# Patient Record
Sex: Female | Born: 1980 | Race: White | Hispanic: No | Marital: Married | Smoking: Never smoker
Health system: Southern US, Community
[De-identification: ages and names within clinical notes are randomized; demographics above are authoritative.]

## PROBLEM LIST (undated history)

## (undated) ENCOUNTER — Inpatient Hospital Stay: Payer: Self-pay

## (undated) DIAGNOSIS — G5600 Carpal tunnel syndrome, unspecified upper limb: Secondary | ICD-10-CM

## (undated) DIAGNOSIS — K219 Gastro-esophageal reflux disease without esophagitis: Secondary | ICD-10-CM

## (undated) DIAGNOSIS — F419 Anxiety disorder, unspecified: Secondary | ICD-10-CM

## (undated) HISTORY — PX: WISDOM TOOTH EXTRACTION: SHX21

---
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
## 2004-04-04 ENCOUNTER — Other Ambulatory Visit: Admission: RE | Admit: 2004-04-04 | Discharge: 2004-04-04 | Payer: Self-pay | Admitting: Obstetrics and Gynecology

## 2005-04-06 ENCOUNTER — Other Ambulatory Visit: Admission: RE | Admit: 2005-04-06 | Discharge: 2005-04-06 | Payer: Self-pay | Admitting: Obstetrics and Gynecology

## 2011-08-05 ENCOUNTER — Ambulatory Visit: Payer: Self-pay

## 2013-08-08 ENCOUNTER — Encounter (HOSPITAL_COMMUNITY): Payer: Self-pay

## 2013-08-08 ENCOUNTER — Other Ambulatory Visit (HOSPITAL_COMMUNITY): Payer: Self-pay

## 2015-05-01 ENCOUNTER — Other Ambulatory Visit: Payer: Self-pay

## 2015-05-01 ENCOUNTER — Encounter: Payer: Self-pay | Admitting: *Deleted

## 2015-05-01 NOTE — Patient Instructions (Signed)
  Your procedure is scheduled on: 05-08-15 Report to [facility] 2ND FLOOR To find out your arrival time please call [phone] between 1PM - 3PM on 05-07-15  Remember: Instructions that are not followed completely may result in serious medical risk, up to and including death, or upon the discretion of your surgeon and anesthesiologist your surgery may need to be rescheduled.    _X___ 1. Do not eat food or drink liquids after midnight. No gum chewing or hard candies.     _X___ 2. No Alcohol for 24 hours before or after surgery.   ____ 3. Bring all medications with you on the day of surgery if instructed.    ____ 4. Notify your doctor if there is any change in your medical condition     (cold, fever, infections).     Do not wear jewelry, make-up, hairpins, clips or nail polish.  Do not wear lotions, powders, or perfumes. You may wear deodorant.  Do not shave 48 hours prior to surgery. Men may shave face and neck.  Do not bring valuables to the hospital.    [facility] is not responsible for any belongings or valuables.               Contacts, dentures or bridgework may not be worn into surgery.  Leave your suitcase in the car. After surgery it may be brought to your room.  For patients admitted to the hospital, discharge time is determined by your  treatment team.   Patients discharged the day of surgery will not be allowed to drive home.   Please read over the following fact sheets that you were given:     __X__ Take these medicines the morning of surgery with A SIP OF WATER:    1. OMEPRAZOLE  2. TAKE AN EXTRA OMEPRAZOLE Tuesday NIGHT  3.   4.  5.  6.  ____ Fleet Enema (as directed)   ____ Use CHG Soap as directed  ____ Use inhalers on the day of surgery  ____ Stop metformin 2 days prior to surgery    ____ Take 1/2 of usual insulin dose the night before surgery and none on the morning of surgery.   ____ Stop Coumadin/Plavix/aspirin-N/A  ____ Stop  Anti-inflammatories-NO NSAIDS OR ASA PRODUCTS-TYLENOL OK   ____ Stop supplements until after surgery.    ____ Bring C-Pap to the hospital.

## 2015-05-08 ENCOUNTER — Encounter
Admission: RE | Disposition: A | Discharge status: Home / Self Care | Payer: Self-pay | Source: Ambulatory Visit | Attending: Specialist

## 2015-05-08 ENCOUNTER — Ambulatory Visit: Payer: BLUE CROSS/BLUE SHIELD | Admitting: Anesthesiology

## 2015-05-08 ENCOUNTER — Ambulatory Visit
Admission: RE | Admit: 2015-05-08 | Discharge: 2015-05-08 | Disposition: A | Discharge status: Home / Self Care | Payer: BLUE CROSS/BLUE SHIELD | Source: Ambulatory Visit | Attending: Specialist | Admitting: Specialist

## 2015-05-08 ENCOUNTER — Encounter: Payer: Self-pay | Admitting: *Deleted

## 2015-05-08 DIAGNOSIS — Z793 Long term (current) use of hormonal contraceptives: Secondary | ICD-10-CM | POA: Diagnosis not present

## 2015-05-08 DIAGNOSIS — Z791 Long term (current) use of non-steroidal anti-inflammatories (NSAID): Secondary | ICD-10-CM | POA: Insufficient documentation

## 2015-05-08 DIAGNOSIS — G5602 Carpal tunnel syndrome, left upper limb: Secondary | ICD-10-CM | POA: Insufficient documentation

## 2015-05-08 DIAGNOSIS — G5601 Carpal tunnel syndrome, right upper limb: Secondary | ICD-10-CM | POA: Insufficient documentation

## 2015-05-08 HISTORY — PX: CARPAL TUNNEL RELEASE: SHX101

## 2015-05-08 HISTORY — DX: Gastro-esophageal reflux disease without esophagitis: K21.9

## 2015-05-08 HISTORY — DX: Anxiety disorder, unspecified: F41.9

## 2015-05-08 LAB — POCT PREGNANCY, URINE: Preg Test, Ur: NEGATIVE

## 2015-05-08 SURGERY — CARPAL TUNNEL RELEASE
Anesthesia: General | Laterality: Bilateral

## 2015-05-08 MED ORDER — CEFAZOLIN SODIUM 1-5 GM-% IV SOLN
INTRAVENOUS | Status: AC
  Filled 2015-05-08: qty 50

## 2015-05-08 MED ORDER — ONDANSETRON HCL 4 MG/2ML IJ SOLN
INTRAMUSCULAR | Status: DC | PRN
  Administered 2015-05-08: 4 mg via INTRAVENOUS

## 2015-05-08 MED ORDER — FENTANYL CITRATE (PF) 100 MCG/2ML IJ SOLN
INTRAMUSCULAR | Status: DC | PRN
  Administered 2015-05-08 (×2): 50 ug via INTRAVENOUS

## 2015-05-08 MED ORDER — ONDANSETRON HCL 4 MG/2ML IJ SOLN: 4.0000 mg | Freq: Once | INTRAMUSCULAR | Status: DC | PRN

## 2015-05-08 MED ORDER — MELOXICAM 7.5 MG PO TABS
15.0000 mg | ORAL_TABLET | Freq: Once | ORAL | Status: AC
  Administered 2015-05-08: 15 mg via ORAL

## 2015-05-08 MED ORDER — KETOROLAC TROMETHAMINE 30 MG/ML IJ SOLN
INTRAMUSCULAR | Status: DC | PRN
  Administered 2015-05-08: 30 mg via INTRAVENOUS

## 2015-05-08 MED ORDER — BUPIVACAINE HCL (PF) 0.5 % IJ SOLN
INTRAMUSCULAR | Status: AC
  Filled 2015-05-08: qty 60

## 2015-05-08 MED ORDER — LACTATED RINGERS IV SOLN
INTRAVENOUS | Status: DC
  Administered 2015-05-08: 50 mL/h via INTRAVENOUS
  Administered 2015-05-08: 07:00:00 via INTRAVENOUS

## 2015-05-08 MED ORDER — HYDROCODONE-ACETAMINOPHEN 5-325 MG PO TABS
ORAL_TABLET | ORAL | Status: AC
  Administered 2015-05-08: 1 via ORAL
  Filled 2015-05-08: qty 1

## 2015-05-08 MED ORDER — HYDROCODONE-ACETAMINOPHEN 5-325 MG PO TABS
1.0000 | ORAL_TABLET | Freq: Four times a day (QID) | ORAL | Status: DC | PRN
  Administered 2015-05-08: 1 via ORAL

## 2015-05-08 MED ORDER — BUPIVACAINE HCL 0.5 % IJ SOLN
INTRAMUSCULAR | Status: DC | PRN
  Administered 2015-05-08: 30 mL

## 2015-05-08 MED ORDER — MELOXICAM 15 MG PO TABS: 15.0000 mg | ORAL_TABLET | Freq: Every day | ORAL | Status: DC

## 2015-05-08 MED ORDER — LIDOCAINE HCL (PF) 1 % IJ SOLN
INTRAMUSCULAR | Status: AC
  Filled 2015-05-08: qty 2

## 2015-05-08 MED ORDER — DEXAMETHASONE SODIUM PHOSPHATE 4 MG/ML IJ SOLN
INTRAMUSCULAR | Status: DC | PRN
  Administered 2015-05-08: 5 mg via INTRAVENOUS

## 2015-05-08 MED ORDER — CEFAZOLIN SODIUM 1-5 GM-% IV SOLN
1.0000 g | Freq: Once | INTRAVENOUS | Status: AC
  Administered 2015-05-08: 2 g via INTRAVENOUS

## 2015-05-08 MED ORDER — FAMOTIDINE 20 MG PO TABS
ORAL_TABLET | ORAL | Status: AC
  Filled 2015-05-08: qty 1

## 2015-05-08 MED ORDER — HYDROCODONE-ACETAMINOPHEN 5-325 MG PO TABS: 1.0000 | ORAL_TABLET | Freq: Four times a day (QID) | ORAL | Status: DC | PRN

## 2015-05-08 MED ORDER — GABAPENTIN 400 MG PO CAPS
ORAL_CAPSULE | ORAL | Status: AC
  Administered 2015-05-08: 400 mg via ORAL
  Filled 2015-05-08: qty 1

## 2015-05-08 MED ORDER — MIDAZOLAM HCL 2 MG/2ML IJ SOLN
INTRAMUSCULAR | Status: DC | PRN
  Administered 2015-05-08: 2 mg via INTRAVENOUS

## 2015-05-08 MED ORDER — PROPOFOL 10 MG/ML IV BOLUS
INTRAVENOUS | Status: DC | PRN
  Administered 2015-05-08: 150 mg via INTRAVENOUS
  Administered 2015-05-08: 50 mg via INTRAVENOUS

## 2015-05-08 MED ORDER — GABAPENTIN 400 MG PO CAPS: 400.0000 mg | ORAL_CAPSULE | Freq: Three times a day (TID) | ORAL | Status: DC

## 2015-05-08 MED ORDER — FENTANYL CITRATE (PF) 100 MCG/2ML IJ SOLN: 25.0000 ug | INTRAMUSCULAR | Status: DC | PRN

## 2015-05-08 MED ORDER — GABAPENTIN 400 MG PO CAPS
400.0000 mg | ORAL_CAPSULE | Freq: Once | ORAL | Status: AC
  Administered 2015-05-08: 400 mg via ORAL

## 2015-05-08 MED ORDER — LIDOCAINE HCL (CARDIAC) 20 MG/ML IV SOLN
INTRAVENOUS | Status: DC | PRN
  Administered 2015-05-08: 60 mg via INTRAVENOUS

## 2015-05-08 MED ORDER — MELOXICAM 7.5 MG PO TABS
ORAL_TABLET | ORAL | Status: AC
  Administered 2015-05-08: 15 mg via ORAL
  Filled 2015-05-08: qty 2

## 2015-05-08 SURGICAL SUPPLY — 23 items
BLADE SURG MINI STRL (BLADE) ×3 IMPLANT
BNDG ESMARK 4X12 TAN STRL LF (GAUZE/BANDAGES/DRESSINGS) ×3 IMPLANT
CANISTER SUCT 1200ML W/VALVE (MISCELLANEOUS) ×3 IMPLANT
CHLORAPREP W/TINT 26ML (MISCELLANEOUS) ×5 IMPLANT
DRAPE EXTREMITY 106X87X128.5 (DRAPES) ×2 IMPLANT
GAUZE FLUFF 18X24 1PLY STRL (GAUZE/BANDAGES/DRESSINGS) ×5 IMPLANT
GAUZE PETRO XEROFOAM 1X8 (MISCELLANEOUS) ×3 IMPLANT
GLOVE BIO SURGEON STRL SZ8 (GLOVE) ×7 IMPLANT
GOWN STRL REUS W/ TWL LRG LVL3 (GOWN DISPOSABLE) ×2 IMPLANT
GOWN STRL REUS W/TWL LRG LVL3 (GOWN DISPOSABLE) ×9
KIT RM TURNOVER STRD PROC AR (KITS) ×3 IMPLANT
NS IRRIG 500ML POUR BTL (IV SOLUTION) ×3 IMPLANT
PACK EXTREMITY ARMC (MISCELLANEOUS) ×3 IMPLANT
PAD GROUND ADULT SPLIT (MISCELLANEOUS) ×3 IMPLANT
PAD PREP 24X41 OB/GYN DISP (PERSONAL CARE ITEMS) ×3 IMPLANT
SPLINT CAST 1 STEP 3X12 (MISCELLANEOUS) ×5 IMPLANT
STOCKINETTE 48X4 2 PLY STRL (GAUZE/BANDAGES/DRESSINGS) ×1 IMPLANT
STOCKINETTE BIAS CUT 4 980044 (GAUZE/BANDAGES/DRESSINGS) ×3 IMPLANT
STOCKINETTE STRL 4IN 9604848 (GAUZE/BANDAGES/DRESSINGS) ×6 IMPLANT
SUT ETHILON 4-0 (SUTURE) ×3
SUT ETHILON 4-0 FS2 18XMFL BLK (SUTURE) ×1
SUT ETHILON 5-0 FS-2 18 BLK (SUTURE) ×3 IMPLANT
SUTURE ETHLN 4-0 FS2 18XMF BLK (SUTURE) ×1 IMPLANT

## 2015-05-08 NOTE — H&P (Signed)
THE PATIENT WAS SEEN IN THE HOLDING AREA.  HISTORY, ALLERGIES, HOME MEDICATIONS AND OPERATIVE PROCEDURE WERE REVIEWED. RISKS AND BENEFITS OF SURGERY DISCUSSED WITH PATIENT AGAIN.  NO CHANGES FROM INITIAL HISTORY AND PHYSICAL NOTED.    

## 2015-05-08 NOTE — Anesthesia Procedure Notes (Signed)
Procedure Name: LMA Insertion Date/Time: 05/08/2015 7:48 AM Performed by: Irving Burton Pre-anesthesia Checklist: Patient identified, Emergency Drugs available, Suction available and Patient being monitored Patient Re-evaluated:Patient Re-evaluated prior to inductionOxygen Delivery Method: Circle system utilized Preoxygenation: Pre-oxygenation with 100% oxygen Intubation Type: IV induction Ventilation: Mask ventilation without difficulty LMA: LMA inserted LMA Size: 3.5 Number of attempts: 1 Airway Equipment and Method: Patient positioned with wedge pillow Placement Confirmation: positive ETCO2 and breath sounds checked- equal and bilateral Tube secured with: Tape Dental Injury: Teeth and Oropharynx as per pre-operative assessment

## 2015-05-08 NOTE — Anesthesia Postprocedure Evaluation (Signed)
  Anesthesia Post-op Note  Patient: Tracey Swanson  Procedure(s) Performed: Procedure(s): CARPAL TUNNEL RELEASE (Bilateral)  Anesthesia type:General  Patient location: PACU  Post pain: Pain level controlled  Post assessment: Post-op Vital signs reviewed, Patient's Cardiovascular Status Stable, Respiratory Function Stable, Patent Airway and No signs of Nausea or vomiting  Post vital signs: Reviewed and stable  Last Vitals:  Filed Vitals:   05/08/15 0908  BP: 160/88  Pulse: 71  Temp: 36.1 C  Resp: 14    Level of consciousness: awake, alert  and patient cooperative  Complications: No apparent anesthesia complications

## 2015-05-08 NOTE — Transfer of Care (Signed)
Immediate Anesthesia Transfer of Care Note  Patient: Tracey Swanson  Procedure(s) Performed: Procedure(s): CARPAL TUNNEL RELEASE (Bilateral)  Patient Location: PACU  Anesthesia Type:General  Level of Consciousness: sedated  Airway & Oxygen Therapy: Patient Spontanous Breathing and Patient connected to face mask oxygen  Post-op Assessment: Report given to RN and Post -op Vital signs reviewed and stable  Post vital signs: Reviewed and stable  Last Vitals:  Filed Vitals:   05/08/15 0908  BP: 160/88  Pulse: 71  Temp: 36.1 C  Resp: 14    Complications: No apparent anesthesia complications

## 2015-05-08 NOTE — Anesthesia Preprocedure Evaluation (Signed)
Anesthesia Evaluation  Patient identified by MRN, date of birth, ID band Patient awake    Reviewed: Allergy & Precautions, NPO status   Airway Mallampati: II       Dental no notable dental hx. (+) Teeth Intact   Pulmonary neg pulmonary ROS,    breath sounds clear to auscultation       Cardiovascular Exercise Tolerance: Good negative cardio ROS Normal cardiovascular exam     Neuro/Psych    GI/Hepatic Neg liver ROS, GERD  Medicated,  Endo/Other  negative endocrine ROS  Renal/GU negative Renal ROS     Musculoskeletal negative musculoskeletal ROS (+)   Abdominal (+) + obese,   Peds negative pediatric ROS (+)  Hematology negative hematology ROS (+)   Anesthesia Other Findings   Reproductive/Obstetrics negative OB ROS                             Anesthesia Physical Anesthesia Plan  ASA: II  Anesthesia Plan: General   Post-op Pain Management:    Induction: Intravenous  Airway Management Planned: LMA  Additional Equipment:   Intra-op Plan:   Post-operative Plan: Extubation in OR  Informed Consent: I have reviewed the patients History and Physical, chart, labs and discussed the procedure including the risks, benefits and alternatives for the proposed anesthesia with the patient or authorized representative who has indicated his/her understanding and acceptance.     Plan Discussed with: CRNA  Anesthesia Plan Comments:         Anesthesia Quick Evaluation

## 2015-05-08 NOTE — Op Note (Signed)
05/08/2015  8:59 AM  PATIENT:  Tracey Swanson    PRE-OPERATIVE DIAGNOSIS: BILATERAL CARPAL TUNNEL SYNDROME POST-OPERATIVE DIAGNOSIS: BILATERAL CARPAL TUNNEL SYNDROME  PROCEDURE:  BILATERAL CARPAL TUNNEL RELEASE  SURGEON: Valinda Hoar, MD    ANESTHESIA:   General  TOURNIQUET TIME: RIGHT: 16 MIN   LEFT:  22 MIN  PREOPERATIVE INDICATIONS:  Tracey Swanson is a  34 y.o. female with a diagnosis of BILATERAL carpal tunnel syndrome who failed conservative measures and elected for surgical management.    The risks benefits and alternatives were discussed with the patient preoperatively including but not limited to the risks of infection, bleeding, nerve injury, incomplete relief of symptoms, pillar pain, cardiopulmonary complications, the need for revision surgery, among others, and the patient was willing to proceed.  OPERATIVE FINDINGS: Thickened volar ligament and nerve compression bilaterally.  OPERATIVE PROCEDURE: The patient is brought to the operating room placed in the supine position. General anesthesia was administered. The right upper extremity was prepped and draped in usual sterile fashion. Time out was performed. The arm was elevated and exsanguinated and the tourniquet was inflated. Incision was made in line with the radial border of the ring finger. The carpal tunnel transverse fascia was identified, cleaned, and incised sharply. The common sensory branches were visualized along with the superficial palmar arch and protected.  The median nerve was protected below. Deep retractors were placed underneath the transverse carpal ligament, protecting the nerve. I released the ligament completely, and then released the proximal distal volar forearm fascia. The nerve was identified, and visualized, and protected throughout the case. No masses or abnormalities were identified in ulnar bursa.  The wounds were irrigated copiously, and the wounds injected, and the skin closed with nylon  followed by a  sterile hand dressing. Sponge and needle counts were correct. An identical procedure was then carried out on the left hand. The motor branches were visualized and intact bilaterally. Sterile hand dressing with splint was applied to the left hand and tourniquet released with good return of blood flow to the left hand. A splint was then added to the right dressing.  The patient tolerated this well, with no complications. Awakened and taken to recovery in good condition.

## 2015-05-08 NOTE — Discharge Instructions (Signed)

## 2016-01-12 ENCOUNTER — Encounter: Payer: Self-pay | Admitting: Emergency Medicine

## 2016-01-12 ENCOUNTER — Emergency Department
Admission: EM | Admit: 2016-01-12 | Discharge: 2016-01-12 | Disposition: A | Discharge status: Home / Self Care | Payer: BLUE CROSS/BLUE SHIELD | Attending: Emergency Medicine | Admitting: Emergency Medicine

## 2016-01-12 ENCOUNTER — Emergency Department: Payer: BLUE CROSS/BLUE SHIELD

## 2016-01-12 DIAGNOSIS — Z79899 Other long term (current) drug therapy: Secondary | ICD-10-CM | POA: Diagnosis not present

## 2016-01-12 DIAGNOSIS — R110 Nausea: Secondary | ICD-10-CM | POA: Diagnosis present

## 2016-01-12 DIAGNOSIS — R42 Dizziness and giddiness: Secondary | ICD-10-CM | POA: Insufficient documentation

## 2016-01-12 DIAGNOSIS — Z791 Long term (current) use of non-steroidal anti-inflammatories (NSAID): Secondary | ICD-10-CM | POA: Diagnosis not present

## 2016-01-12 LAB — CBC
HEMATOCRIT: 42.6 % (ref 35.0–47.0)
Hemoglobin: 14.2 g/dL (ref 12.0–16.0)
MCH: 27.9 pg (ref 26.0–34.0)
MCHC: 33.4 g/dL (ref 32.0–36.0)
MCV: 83.7 fL (ref 80.0–100.0)
PLATELETS: 156 10*3/uL (ref 150–440)
RBC: 5.09 MIL/uL (ref 3.80–5.20)
RDW: 12.7 % (ref 11.5–14.5)
WBC: 12.4 10*3/uL — ABNORMAL HIGH (ref 3.6–11.0)

## 2016-01-12 LAB — URINALYSIS COMPLETE WITH MICROSCOPIC (ARMC ONLY)
BILIRUBIN URINE: NEGATIVE
GLUCOSE, UA: NEGATIVE mg/dL
Hgb urine dipstick: NEGATIVE
KETONES UR: NEGATIVE mg/dL
NITRITE: NEGATIVE
Protein, ur: NEGATIVE mg/dL
Specific Gravity, Urine: 1.026 (ref 1.005–1.030)
pH: 7 (ref 5.0–8.0)

## 2016-01-12 LAB — COMPREHENSIVE METABOLIC PANEL
ALBUMIN: 4 g/dL (ref 3.5–5.0)
ALK PHOS: 75 U/L (ref 38–126)
ALT: 50 U/L (ref 14–54)
AST: 29 U/L (ref 15–41)
Anion gap: 5 (ref 5–15)
BILIRUBIN TOTAL: 0.6 mg/dL (ref 0.3–1.2)
BUN: 9 mg/dL (ref 6–20)
CO2: 25 mmol/L (ref 22–32)
CREATININE: 0.66 mg/dL (ref 0.44–1.00)
Calcium: 8.7 mg/dL — ABNORMAL LOW (ref 8.9–10.3)
Chloride: 107 mmol/L (ref 101–111)
GFR calc Af Amer: >60 mL/min (ref >60)
GLUCOSE: 93 mg/dL (ref 65–99)
POTASSIUM: 3.8 mmol/L (ref 3.5–5.1)
Sodium: 137 mmol/L (ref 135–145)
TOTAL PROTEIN: 6.9 g/dL (ref 6.5–8.1)

## 2016-01-12 LAB — LIPASE, BLOOD: Lipase: 41 U/L (ref 11–51)

## 2016-01-12 LAB — TSH: TSH: 0.532 u[IU]/mL (ref 0.350–4.500)

## 2016-01-12 MED ORDER — LORAZEPAM 1 MG PO TABS: 1.0000 mg | ORAL_TABLET | Freq: Two times a day (BID) | ORAL | Status: AC

## 2016-01-12 MED ORDER — ONDANSETRON HCL 4 MG PO TABS: 4.0000 mg | ORAL_TABLET | Freq: Every day | ORAL | Status: DC | PRN

## 2016-01-12 MED ORDER — ONDANSETRON 4 MG PO TBDP
ORAL_TABLET | ORAL | Status: AC
  Filled 2016-01-12: qty 1

## 2016-01-12 MED ORDER — ONDANSETRON 4 MG PO TBDP
4.0000 mg | ORAL_TABLET | Freq: Once | ORAL | Status: AC | PRN
  Administered 2016-01-12: 4 mg via ORAL

## 2016-01-12 NOTE — Discharge Instructions (Signed)

## 2016-01-12 NOTE — ED Provider Notes (Signed)
[facility] Emergency Department Provider Note        Time seen: ----------------------------------------- 6:55 PM on 01/12/2016 -----------------------------------------    I have reviewed the triage vital signs and the nursing notes.   HISTORY  Chief Complaint Nausea and Dizziness    HPI Tracey Swanson is a 35 y.o. female who presents ER for nausea and dizziness.Patient is also complaining of pain in her low back and hips. Onset of her symptoms was this morning. She had taken gabapentin and Tylenol for pain without much relief. She has had carpal tunnel surgery in September and states the left wrist starting to bother her again. She does admit to being under increased stress at her job, denies fevers, chills, chest pain, shortness of breath, diarrhea.   Past Medical History  Diagnosis Date  . Anxiety   . GERD (gastroesophageal reflux disease)     There are no active problems to display for this patient.   Past Surgical History  Procedure Laterality Date  . Breast biopsy    . Wisdom tooth extraction    . Carpal tunnel release Bilateral 05/08/2015    Procedure: CARPAL TUNNEL RELEASE;  Surgeon: Deeann SaintHoward Miller, MD;  Location: [facility] ORS;  Service: Orthopedics;  Laterality: Bilateral;    Allergies Review of patient's allergies indicates no known allergies.  Social History Social History  Substance Use Topics  . Smoking status: Never Smoker   . Smokeless tobacco: None  . Alcohol Use: Yes     Comment: SOCIALLY    Review of Systems Constitutional: Negative for fever. Eyes: Negative for visual changes. ENT: Negative for sore throat. Cardiovascular: Negative for chest pain. Respiratory: Negative for shortness of breath. Gastrointestinal: Negative for abdominal pain, Positive for nausea Genitourinary: Negative for dysuria. Musculoskeletal: Positive for back pain Skin: Negative for rash. Neurological: Negative for headaches, positive for  weakness Psychiatric: Positive for anxiety and stress  10-point ROS otherwise negative.  ____________________________________________   PHYSICAL EXAM:  VITAL SIGNS: ED Triage Vitals  Enc Vitals Group     BP 01/12/16 1734 102/57 mmHg     Pulse Rate 01/12/16 1734 91     Resp 01/12/16 1734 20     Temp 01/12/16 1734 98.2 F (36.8 C)     Temp Source 01/12/16 1734 Oral     SpO2 01/12/16 1734 98 %     Weight 01/12/16 1734 180 lb (81.647 kg)     Height 01/12/16 1734 5\' 4"  (1.626 m)     Head Cir --      Peak Flow --      Pain Score 01/12/16 1740 7     Pain Loc --      Pain Edu? --      Excl. in GC? --     Constitutional: Alert and oriented. Well appearing and in no distress. Eyes: Conjunctivae are normal. PERRL. Normal extraocular movements. ENT   Head: Normocephalic and atraumatic.   Nose: No congestion/rhinnorhea.   Mouth/Throat: Mucous membranes are moist.   Neck: No stridor. Cardiovascular: Normal rate, regular rhythm. No murmurs, rubs, or gallops. Respiratory: Normal respiratory effort without tachypnea nor retractions. Breath sounds are clear and equal bilaterally. No wheezes/rales/rhonchi. Gastrointestinal: Soft and nontender. Normal bowel sounds Musculoskeletal: Nontender with normal range of motion in all extremities. No lower extremity tenderness nor edema. Neurologic:  Normal speech and language. No gross focal neurologic deficits are appreciated.  Skin:  Skin is warm, dry and intact. No rash noted. Psychiatric: Depressed mood and affect  ____________________________________________  ED COURSE:  Pertinent labs & imaging results that were available during my care of the patient were reviewed by me and considered in my medical decision making (see chart for details). Patient is in no acute distress, symptoms are likely related to stress and anxiety. I will check basic labs and reevaluate. ____________________________________________    LABS (pertinent  positives/negatives)  Labs Reviewed  COMPREHENSIVE METABOLIC PANEL - Abnormal; Notable for the following:    Calcium 8.7 (*)    All other components within normal limits  CBC - Abnormal; Notable for the following:    WBC 12.4 (*)    All other components within normal limits  URINALYSIS COMPLETEWITH MICROSCOPIC ([facility] ONLY) - Abnormal; Notable for the following:    Color, Urine YELLOW (*)    APPearance CLEAR (*)    Leukocytes, UA TRACE (*)    Bacteria, UA RARE (*)    Squamous Epithelial / LPF 0-5 (*)    All other components within normal limits  LIPASE, BLOOD  POC URINE PREG, ED   Radiology: Lumbar spine films are unremarkable ____________________________________________  FINAL ASSESSMENT AND PLAN  Weakness, anxiety, nausea  Plan: Patient with labs and imaging as dictated above. Patient presents with symptoms likely secondary to stress and anxiety. Her labs and test to this point have been unremarkable. I will advise close follow-up with her doctor for reevaluation and will provide Ativan to take as needed.   Emily Filbert, MD   Note: This dictation was prepared with Dragon dictation. Any transcriptional errors that result from this process are unintentional   Emily Filbert, MD 01/12/16 [phone]

## 2016-01-12 NOTE — ED Notes (Signed)
C/O nausea and dizziness, pains to lower back and hips.  Onset of symptoms this morning.  Patient has taken a gabapentin and tylenol for pain, no relief.  Patient had bilateral Carpel Tunnel surgery September 2016.

## 2016-01-12 NOTE — ED Notes (Signed)
Urine preg negative

## 2016-11-04 ENCOUNTER — Other Ambulatory Visit: Payer: Self-pay | Admitting: Family Medicine

## 2016-11-04 DIAGNOSIS — Z1231 Encounter for screening mammogram for malignant neoplasm of breast: Secondary | ICD-10-CM

## 2016-11-24 ENCOUNTER — Inpatient Hospital Stay
Admission: RE | Admit: 2016-11-24 | Discharge: 2016-11-24 | Disposition: A | Discharge status: Home / Self Care | Payer: Self-pay | Source: Ambulatory Visit | Attending: *Deleted | Admitting: *Deleted

## 2016-11-24 ENCOUNTER — Other Ambulatory Visit: Payer: Self-pay | Admitting: *Deleted

## 2016-11-24 DIAGNOSIS — Z9289 Personal history of other medical treatment: Secondary | ICD-10-CM

## 2016-11-27 ENCOUNTER — Other Ambulatory Visit: Payer: Self-pay | Admitting: Family Medicine

## 2016-11-27 ENCOUNTER — Ambulatory Visit
Admission: RE | Admit: 2016-11-27 | Discharge: 2016-11-27 | Disposition: A | Discharge status: Home / Self Care | Payer: BLUE CROSS/BLUE SHIELD | Source: Ambulatory Visit | Attending: Family Medicine | Admitting: Family Medicine

## 2016-11-27 DIAGNOSIS — Z1231 Encounter for screening mammogram for malignant neoplasm of breast: Secondary | ICD-10-CM | POA: Diagnosis present

## 2017-02-23 ENCOUNTER — Other Ambulatory Visit: Payer: Self-pay | Admitting: Obstetrics and Gynecology

## 2017-02-23 DIAGNOSIS — Z369 Encounter for antenatal screening, unspecified: Secondary | ICD-10-CM

## 2017-02-23 LAB — OB RESULTS CONSOLE RUBELLA ANTIBODY, IGM: Rubella: IMMUNE

## 2017-02-23 LAB — OB RESULTS CONSOLE VARICELLA ZOSTER ANTIBODY, IGG: VARICELLA IGG: IMMUNE

## 2017-02-23 LAB — OB RESULTS CONSOLE HEPATITIS B SURFACE ANTIGEN: Hepatitis B Surface Ag: NEGATIVE

## 2017-03-08 ENCOUNTER — Encounter: Payer: Self-pay | Admitting: *Deleted

## 2017-03-08 ENCOUNTER — Ambulatory Visit (HOSPITAL_BASED_OUTPATIENT_CLINIC_OR_DEPARTMENT_OTHER)
Admission: RE | Admit: 2017-03-08 | Discharge: 2017-03-08 | Disposition: A | Discharge status: Home / Self Care | Payer: BLUE CROSS/BLUE SHIELD | Source: Ambulatory Visit | Attending: Maternal & Fetal Medicine | Admitting: Maternal & Fetal Medicine

## 2017-03-08 ENCOUNTER — Ambulatory Visit
Admission: RE | Admit: 2017-03-08 | Discharge: 2017-03-08 | Disposition: A | Discharge status: Home / Self Care | Payer: BLUE CROSS/BLUE SHIELD | Source: Ambulatory Visit | Attending: Maternal & Fetal Medicine | Admitting: Maternal & Fetal Medicine

## 2017-03-08 ENCOUNTER — Other Ambulatory Visit: Payer: Self-pay | Admitting: Obstetrics and Gynecology

## 2017-03-08 VITALS — BP 125/64 | Temp 98.1°F | Resp 16 | Ht 64.0 in | Wt 185.2 lb

## 2017-03-08 DIAGNOSIS — Z3A14 14 weeks gestation of pregnancy: Secondary | ICD-10-CM | POA: Insufficient documentation

## 2017-03-08 DIAGNOSIS — Z3689 Encounter for other specified antenatal screening: Secondary | ICD-10-CM | POA: Diagnosis present

## 2017-03-08 DIAGNOSIS — Z369 Encounter for antenatal screening, unspecified: Secondary | ICD-10-CM

## 2017-03-08 DIAGNOSIS — Z803 Family history of malignant neoplasm of breast: Secondary | ICD-10-CM | POA: Diagnosis not present

## 2017-03-08 HISTORY — DX: Carpal tunnel syndrome, unspecified upper limb: G56.00

## 2017-03-08 NOTE — Progress Notes (Addendum)
Ref[facility]  [facility] Length of Consultation: 40 minutes   Ms. Tracey Swanson  was [facility] to [facility] for genetic counseling to review prenatal screening and testing options.  This note summarizes the information we discussed.    We offered the following routine screening tests for this pregnancy:  First trimester screening, which includes nuchal translucency ultrasound screen and first trimester maternal serum marker screening.  The nuchal translucency has approximately an 80% detection rate for Down syndrome and can be positive for other chromosome abnormalities as well as congenital heart defects.  When combined with a maternal serum marker screening, the detection rate is up to 90% for Down syndrome and up to 97% for trisomy 18.     Maternal serum marker screening, a blood test that measures pregnancy proteins, can provide risk assessments for Down syndrome, trisomy 18, and open neural tube defects (spina bifida, anencephaly). Because it does not directly examine the fetus, it cannot positively diagnose or rule out these problems.  Targeted ultrasound uses high frequency sound waves to create an image of the developing fetus.  An ultrasound is often recommended as a routine means of evaluating the pregnancy.  It is also used to screen for fetal anatomy problems (for example, a heart defect) that might be suggestive of a chromosomal or other abnormality.   Should these screening tests indicate an increased concern, then the following additional testing options would be offered:  The chorionic villus sampling procedure is available for first trimester chromosome analysis.  This involves the withdrawal of a small amount of chorionic villi (tissue from the developing placenta).  Risk of pregnancy loss is estimated to be approximately 1 in 200 to 1 in 100 (0.5 to 1%).  There is approximately a 1% (1 in 100) chance that the CVS chromosome results will  be unclear.  Chorionic villi cannot be tested for neural tube defects.     Amniocentesis involves the removal of a small amount of amniotic fluid from the sac surrounding the fetus with the use of a thin needle inserted through the maternal abdomen and uterus.  Ultrasound guidance is used throughout the procedure.  Fetal cells from amniotic fluid are directly evaluated and > 99.5% of chromosome problems and > 98% of open neural tube defects can be detected. This procedure is generally performed after the 15th week of pregnancy.  The main risks to this procedure include complications leading to miscarriage in less than 1 in 200 cases (0.5%).  As another option for information if the pregnancy is suspected to be an an increased chance for certain chromosome conditions, we also reviewed the availability of cell free fetal DNA testing from maternal blood to determine whether or not the baby may have either Down syndrome, trisomy 113, or trisomy 8918.  This test utilizes a maternal blood sample and DNA sequencing technology to isolate circulating cell free fetal DNA from maternal plasma.  The fetal DNA can then be analyzed for DNA sequences that are derived from the three most common chromosomes involved in aneuploidy, chromosomes 13, 18, and 21.  If the overall amount of DNA is greater than the expected level for any of these chromosomes, aneuploidy is suspected.  While we do not consider it a replacement for invasive testing and karyotype analysis, a negative result from this testing would be reassuring, though not a guarantee of a normal chromosome complement for the baby.  An abnormal result is certainly suggestive of an abnormal chromosome complement,  though we would still recommend CVS or amniocentesis to confirm any findings from this testing.  Cystic Fibrosis and Spinal Muscular Atrophy (SMA) screening were also discussed with the patient. Both conditions are recessive, which means that both parents must be  carriers in order to have a child with the disease.  Cystic fibrosis (CF) is one of the most common genetic conditions in persons of Caucasian ancestry.  This condition occurs in approximately 1 in 2,500 Caucasian persons and results in thickened secretions in the lungs, digestive, and reproductive systems.  For a baby to be at risk for having CF, both of the parents must be carriers for this condition.  Approximately 1 in 23 Caucasian persons is a carrier for CF.  Current carrier testing looks for the most common mutations in the gene for CF and can detect approximately 90% of carriers in the Caucasian population.  This means that the carrier screening can greatly reduce, but cannot eliminate, the chance for an individual to have a child with CF.  If an individual is found to be a carrier for CF, then carrier testing would be available for the partner. As part of [place]'s newborn screening profile, all babies born in the state of [place] will have a two-tier screening process.  Specimens are first tested to determine the concentration of immunoreactive trypsinogen (IRT).  The top 5% of specimens with the highest IRT values then undergo DNA testing using a panel of over 40 common CF mutations. SMA is a neurodegenerative disorder that leads to atrophy of skeletal muscle and overall weakness.  This condition is also more prevalent in the Caucasian population, with 1 in 40-1 in 60 persons being a carrier and 1 in 6,000-1 in 10,000 children being affected.  There are multiple forms of the disease, with some causing death in infancy to other forms with survival into adulthood.  The genetics of SMA is complex, but carrier screening can detect up to 95% of carriers in the Caucasian population.  Similar to CF, a negative result can greatly reduce, but cannot eliminate, the chance to have a child with SMA.  We obtained a detailed family history and pregnancy history.  The patient reported that her mother had  breast cancer at age 35 years with a recurrence at age 61 years.  She is healthy at this time and reportedly had negative genetic testing for breast cancer, as did she herself.  We reviewed that if her mother did not have an identified gene change to predispose to breast cancer, then testing for other family members would be less helpful.  Tracey Swanson is following guidelines for increased surveillance as recommended by her doctor. The remainder of the family history is remarkable for diabetes, hypertension and anxiety in both sides of the family.  We reviewed that these conditions often have both inherited and life events/lifestyle factors and do nto have clear genetic testing options.  The remainder of the family history was reported to be unremarkable for birth defects, intellectual delays, recurrent pregnancy loss or known chromosome abnormalities.  Tracey Swanson stated that this her first pregnancy.  She reported no complications in this pregnancy.  Prior to learning she was pregnant at 4-5 weeks, she had some alcohol exposure, but none since that time.  She is taking Buspar for anxiety, 75 mg twice per day.  This medication as limited human reproductive data, but has not been shown to increase the risk for birth defects in limited animal studies.  After consideration  of the options, Tracey Swanson elected to proceed with first trimester screening and to decline CF and SMA carrier screening.  However, the ultrasound at the time of the visit showed the gestational age to be consistent with  14 weeks.  Fetal anatomy could not be assessed due to early gestational age, but she was too far along for first trimester screening.  Please refer to the ultrasound report for details of that study.  We would recommend an anatomy ultrasound after [redacted] weeks gestation and maternal serum marker screening (Quad screen) at her next regular OB visit between 16-[redacted] weeks gestation.  Tracey Swanson was encouraged to call with questions or  concerns.  We can be contacted at [phone].    Cherly Anderson, MS, CGC  I was immediately available and supervising. Argentina Ponder, MD [facility]

## 2017-08-02 ENCOUNTER — Observation Stay
Admission: EM | Admit: 2017-08-02 | Discharge: 2017-08-02 | Disposition: A | Discharge status: Home / Self Care | Payer: BLUE CROSS/BLUE SHIELD | Attending: Obstetrics and Gynecology | Admitting: Obstetrics and Gynecology

## 2017-08-02 DIAGNOSIS — O4290 Premature rupture of membranes, unspecified as to length of time between rupture and onset of labor, unspecified weeks of gestation: Secondary | ICD-10-CM | POA: Diagnosis not present

## 2017-08-02 DIAGNOSIS — O2690 Pregnancy related conditions, unspecified, unspecified trimester: Secondary | ICD-10-CM | POA: Diagnosis present

## 2017-08-02 DIAGNOSIS — Z3A34 34 weeks gestation of pregnancy: Secondary | ICD-10-CM | POA: Insufficient documentation

## 2017-08-02 DIAGNOSIS — Z3490 Encounter for supervision of normal pregnancy, unspecified, unspecified trimester: Secondary | ICD-10-CM

## 2017-08-02 NOTE — Progress Notes (Unsigned)
Wynelle Clevelandshley L Berke is a 36 y.o. female. She is at 7421w2d gestation. Patient's last menstrual period was 12/05/2016. Estimated Date of Delivery: 09/11/17  [facility] Chief complaint:  Location: Onset/timing: Duration: Quality:  Severity: Aggravating or alleviating conditions: Associated signs/symptoms: Context:  S: Resting comfortably. no CTX, no VB.C/O LOF  + fetal movt  Maternal Medical History:   Past Medical History:  Diagnosis Date  . Anxiety   . Carpal tunnel syndrome   . Carpal tunnel syndrome   . GERD (gastroesophageal reflux disease)     Past Surgical History:  Procedure Laterality Date  . BREAST BIOPSY Left 1998   surgical bx  . CARPAL TUNNEL RELEASE Bilateral 05/08/2015   Procedure: CARPAL TUNNEL RELEASE;  Surgeon: Deeann SaintHoward Miller, MD;  Location: [facility] ORS;  Service: Orthopedics;  Laterality: Bilateral;  . WISDOM TOOTH EXTRACTION      No Known Allergies  Prior to Admission medications   Medication Sig Start Date End Date Taking? Authorizing Provider  buPROPion (WELLBUTRIN) 75 MG tablet Take 75 mg by mouth 2 (two) times daily.    [provider]  folic acid (FOLVITE) 400 MCG tablet Take 400 mcg by mouth daily.    [provider]  gabapentin (NEURONTIN) 400 MG capsule Take 1 capsule (400 mg total) by mouth 3 (three) times daily. Patient not taking: Reported on 03/08/2017 05/08/15   Deeann SaintMiller, Howard, MD  HYDROcodone-acetaminophen [facility](NORCO) 5-325 MG per tablet Take 1-2 tablets by mouth every 6 (six) hours as needed. Patient not taking: Reported on 03/08/2017 05/08/15   Deeann SaintMiller, Howard, MD  meloxicam (MOBIC) 15 MG tablet Take 1 tablet (15 mg total) by mouth daily. Patient not taking: Reported on 03/08/2017 05/08/15   Deeann SaintMiller, Howard, MD  omeprazole (PRILOSEC) 20 MG capsule Take 20 mg by mouth as needed.    [provider]  ondansetron (ZOFRAN) 4 MG tablet Take 1 tablet (4 mg total) by mouth daily as needed for nausea or  vomiting. Patient not taking: Reported on 03/08/2017 01/12/16   Emily FilbertWilliams, Jonathan E, MD  Prenatal Vit-Fe Fumarate-FA (PRENATAL MULTIVITAMIN) TABS tablet Take 1 tablet by mouth daily at 12 noon.    [provider]  sertraline (ZOLOFT) 50 MG tablet Take 50 mg by mouth as needed.    [provider]  traMADol (ULTRAM) 50 MG tablet Take 50 mg by mouth every 6 (six) hours as needed.    [provider]     Social History: She  reports that  has never smoked. she has never used smokeless tobacco. She reports that she drinks alcohol. She reports that she does not use drugs.  Family History: family history includes Breast cancer in her maternal aunt; Breast cancer (age of onset: 2134) in her mother. *** no history of gyn cancers  Review of Systems: A full review of systems was performed and negative except as noted in the HPI.     O:  LMP 12/05/2016  No results found for this or any previous visit (from the past 48 hour(s)).   Constitutional: NAD, AAOx3  HE/ENT: extraocular movements grossly intact, moist mucous membranes CV: RRR PULM: nl respiratory effort, CTABL     Abd: gravid, non-tender, non-distended, soft      Ext: Non-tender, Nonedmeatous   Psych: mood appropriate, speech normal Pelvic RN: negative Nitrazine   NST: reactive Baseline:  Variability: moderate Accelerations present x >2 Decelerations absent Time 20mins    A/P: 36 y.o. 7421w2d here for antenatal surveillance forLOF , no  evidence  Labor: not present.   Fetal Wellbeing: Reassuring Cat 1 tracing.  Reactive NST   D/c home stable, precautions reviewed, follow-up as scheduled.   ----- Suzy Bouchardhomas J SChermerhorn, MD Attending Obstetrician and Gynecologist [facility], Department of OB/GYN [facility]

## 2017-08-02 NOTE — OB Triage Note (Signed)
Pt presents from ED c/o leaking fluid that started around 1300. Pt denies VB, and states +FM. Pt states fluid was clear, odorless and the size of a quarter. Fetal monitors applied and assessing. Nitrazene negative

## 2017-08-05 LAB — OB RESULTS CONSOLE HIV ANTIBODY (ROUTINE TESTING): HIV: NONREACTIVE

## 2017-08-06 LAB — OB RESULTS CONSOLE RPR: RPR: NONREACTIVE

## 2017-08-06 NOTE — Discharge Summary (Signed)
34 + 6 week c/o LOF   RN check and negative nitrazine  Reassuring fetal monitoring . Pt d/c home

## 2017-08-07 LAB — OB RESULTS CONSOLE GC/CHLAMYDIA
CHLAMYDIA, DNA PROBE: NEGATIVE
GC PROBE AMP, GENITAL: NEGATIVE

## 2017-08-07 LAB — OB RESULTS CONSOLE GBS: GBS: NEGATIVE

## 2017-08-12 ENCOUNTER — Inpatient Hospital Stay
Admission: EM | Admit: 2017-08-12 | Discharge: 2017-08-12 | Disposition: A | Discharge status: Home / Self Care | Payer: BLUE CROSS/BLUE SHIELD | Attending: Obstetrics and Gynecology | Admitting: Obstetrics and Gynecology

## 2017-08-12 DIAGNOSIS — R030 Elevated blood-pressure reading, without diagnosis of hypertension: Secondary | ICD-10-CM | POA: Diagnosis present

## 2017-08-12 DIAGNOSIS — Z3A37 37 weeks gestation of pregnancy: Secondary | ICD-10-CM | POA: Insufficient documentation

## 2017-08-12 DIAGNOSIS — O26893 Other specified pregnancy related conditions, third trimester: Secondary | ICD-10-CM | POA: Diagnosis not present

## 2017-08-12 DIAGNOSIS — O139 Gestational [pregnancy-induced] hypertension without significant proteinuria, unspecified trimester: Secondary | ICD-10-CM

## 2017-08-12 LAB — COMPREHENSIVE METABOLIC PANEL
ALBUMIN: 2.9 g/dL — AB (ref 3.5–5.0)
ALT: 15 U/L (ref 14–54)
AST: 21 U/L (ref 15–41)
Alkaline Phosphatase: 180 U/L — ABNORMAL HIGH (ref 38–126)
Anion gap: 6 (ref 5–15)
BUN: 5 mg/dL — AB (ref 6–20)
CHLORIDE: 107 mmol/L (ref 101–111)
CO2: 23 mmol/L (ref 22–32)
Calcium: 8.8 mg/dL — ABNORMAL LOW (ref 8.9–10.3)
Creatinine, Ser: 0.47 mg/dL (ref 0.44–1.00)
GFR calc Af Amer: >60 mL/min (ref >60)
Glucose, Bld: 73 mg/dL (ref 65–99)
POTASSIUM: 4.4 mmol/L (ref 3.5–5.1)
SODIUM: 136 mmol/L (ref 135–145)
Total Bilirubin: 0.3 mg/dL (ref 0.3–1.2)
Total Protein: 6.4 g/dL — ABNORMAL LOW (ref 6.5–8.1)

## 2017-08-12 LAB — PROTEIN / CREATININE RATIO, URINE
Creatinine, Urine: 34 mg/dL
PROTEIN CREATININE RATIO: 0.21 mg/mg{creat} — AB (ref 0.00–0.15)
Total Protein, Urine: 7 mg/dL

## 2017-08-12 LAB — CBC
HEMATOCRIT: 39 % (ref 35.0–47.0)
Hemoglobin: 12.8 g/dL (ref 12.0–16.0)
MCH: 26.5 pg (ref 26.0–34.0)
MCHC: 32.8 g/dL (ref 32.0–36.0)
MCV: 80.9 fL (ref 80.0–100.0)
Platelets: 155 10*3/uL (ref 150–440)
RBC: 4.82 MIL/uL (ref 3.80–5.20)
RDW: 15.1 % — AB (ref 11.5–14.5)
WBC: 10.6 10*3/uL (ref 3.6–11.0)

## 2017-08-12 MED ORDER — HYDRALAZINE HCL 20 MG/ML IJ SOLN: 10.0000 mg | Freq: Once | INTRAMUSCULAR | Status: DC | PRN

## 2017-08-12 MED ORDER — LABETALOL HCL 5 MG/ML IV SOLN: 20.0000 mg | INTRAVENOUS | Status: DC | PRN

## 2017-08-12 NOTE — OB Triage Provider Note (Signed)
TRIAGE VISIT with NST   Wynelle Clevelandshley L Scarantino is a 36 y.o. G1P0. She is at 967w0d gestation, presenting with elevated BP at home on her own cuff. BP's normal here in hospital. No Floaters, visual changes, HA's or RUQ pain.   Indication: Elevated BP's at home on her machine   S: Resting comfortably. No  CTX, no VB. Active fetal movement. Concerned about high BP's at home. Also, c/o "feeling out of it".  O:  BP 124/83   Pulse 87   Temp 98.2 F (36.8 C) (Oral)   Resp 18   Ht 5\' 4"  (1.626 m)   Wt 99.8 kg (220 lb)   LMP 12/05/2016   BMI 37.76 kg/m  Results for orders placed or performed during the hospital encounter of 08/12/17 (from the past 48 hour(s))  Protein / creatinine ratio, urine   Collection Time: 08/12/17 11:41 AM  Result Value Ref Range   Creatinine, Urine 34 mg/dL   Total Protein, Urine 7 mg/dL   Protein Creatinine Ratio 0.21 (H) 0.00 - 0.15 mg/mg[Cre]  CBC   Collection Time: 08/12/17 12:43 PM  Result Value Ref Range   WBC 10.6 3.6 - 11.0 K/uL   RBC 4.82 3.80 - 5.20 MIL/uL   Hemoglobin 12.8 12.0 - 16.0 g/dL   HCT 29.539.0 62.135.0 - 30.847.0 %   MCV 80.9 80.0 - 100.0 fL   MCH 26.5 26.0 - 34.0 pg   MCHC 32.8 32.0 - 36.0 g/dL   RDW 65.715.1 (H) 84.611.5 - 96.214.5 %   Platelets 155 150 - 440 K/uL     Gen: NAD, AAOx3   Resp reg and non-labored    Abd: Gravid     Ext: Non-tender, Nonedmeatous    NST/FHT: Cat 1 strip, +accels, no decels TOCO: q 5-7 mins, pt is not feeling at this time SVE: 2/60%/vtx-2 at office in am   NST: Reactive. See FHT above for particulars.  A/P:  36 y.o. G1P0 6467w0d with Gest HTN.   Labor: not present.   Fetal Wellbeing: NST reactive Reassuring Cat 1 tracing.  D/c home stable, precautions reviewed, follow-up as scheduled.  P:C ratio is 210 today and there are not findings for pre-ecclampsia. Will continue to monitor BP in office as well at home. FU on 08/18/17 or come in for any elevated BP's or HA, Blurred vision, floaters, RUQ pain.

## 2017-08-12 NOTE — Discharge Summary (Signed)
Obstetric Discharge Summary Reason for Admission: BP management at 37 weeks Prenatal Procedures: NST  Intrapartum Procedures:N/A Postpartum Procedures:N/A Complications-Operative and Postpartum: Here for BP management  Hemoglobin  Date Value Ref Range Status  08/12/2017 12.8 12.0 - 16.0 g/dL Final   HCT  Date Value Ref Range Status  08/12/2017 39.0 35.0 - 47.0 % Final    Physical Exam:  General: alert, cooperative and appears stated age  Heart: Pulse is reg Resp reg and non-labored Extrems:2+edema, Reflexes 1-2+/0  Uterine Fundus: Gravid at 39 cms  DVT Evaluation:absent   Discharge Diagnoses: IUP at 37 weeks with elevated BP's now normalized  Discharge Information: Date: 08/12/2017 Activity: Up ad lib Diet: routine Medications: tylenol Condition: stable Instructions: refer to practice specific booklet and rest at home, fu here if BP's are rangin > 140/90's Discharge to: home NST reactive with 2 accels 15 x 15 BPM noted Dr Feliberto GottronSchermerhorn is aware of this pt status and agrees with plan of care 24 hour urine for protein:Creatinine started today for tomorrow to the lab Will do NST and AFI on 08/18/17 Newborn Data: This patient has no babies on file.  Sharee Pimplearon W Katherina Wimer 08/12/2017, 1:30 PM

## 2017-08-12 NOTE — Progress Notes (Signed)
24 hour urine collection started, start time 1045

## 2017-08-12 NOTE — OB Triage Note (Signed)
[name] here for BP evaluation, reports BP elevated at home, and [facility]. Denies H/A, visual changes, bleeding, LOF. Reports swelling in lower extremities and hands. Reports itchy rash on upper abdomen, denies epigastric pain.

## 2017-08-12 NOTE — Discharge Instructions (Signed)
Hypertension During Pregnancy Hypertension is also called high blood pressure. High blood pressure means that the force of your blood moving in your body is too strong. When you are pregnant, this condition should be watched carefully. It can cause problems for you and your baby. Follow these instructions at home: Eating and drinking  Drink enough fluid to keep your pee (urine) clear or pale yellow.  Eat healthy foods that are low in salt (sodium). ? Do not add salt to your food. ? Check labels on foods and drinks to see much salt is in them. Look on the label where you see "Sodium." Lifestyle  Do not use any products that contain nicotine or tobacco, such as cigarettes and e-cigarettes. If you need help quitting, ask your doctor.  Do not use alcohol.  Avoid caffeine.  Avoid stress. Rest and get plenty of sleep. General instructions  Take over-the-counter and prescription medicines only as told by your doctor.  While lying down, lie on your left side. This keeps pressure off your baby.  While sitting or lying down, raise (elevate) your feet. Try putting some pillows under your lower legs.  Exercise regularly. Ask your doctor what kinds of exercise are best for you.  Keep all prenatal and follow-up visits as told by your doctor. This is important. Contact a doctor if:  You have symptoms that your doctor told you to watch for, such as: ? Fever. ? Throwing up (vomiting). ? Headache. Get help right away if:  You have very bad pain in your belly (abdomen).  You are throwing up, and this does not get better with treatment.  You suddenly get swelling in your hands, ankles, or face.  You gain 4 lb (1.8 kg) or more in 1 week.  You get bleeding from your vagina.  You have blood in your pee.  You do not feel your baby moving as much as normal.  You have a change in vision.  You have muscle twitching or sudden tightening (spasms).  You have trouble breathing.  Your  lips or fingernails turn blue. This information is not intended to replace advice given to you by your health care provider. Make sure you discuss any questions you have with your health care provider. Document Released: 09/12/2010 Document Revised: 04/21/2016 Document Reviewed: 04/21/2016 Elsevier Interactive Patient Education  2018 [employer]   Hypertension During Pregnancy Hypertension, commonly called high blood pressure, is when the force of blood pumping through your arteries is too strong. Arteries are blood vessels that carry blood from the heart throughout the body. Hypertension during pregnancy can cause problems for you and your baby. Your baby may be born early (prematurely) or may not weigh as much as he or she should at birth. Very bad cases of hypertension during pregnancy can be life-threatening. Different types of hypertension can occur during pregnancy. These include:  Chronic hypertension. This happens when: ? You have hypertension before pregnancy and it continues during pregnancy. ? You develop hypertension before you are [redacted] weeks pregnant, and it continues during pregnancy.  Gestational hypertension. This is hypertension that develops after the 20th week of pregnancy.  Preeclampsia, also called toxemia of pregnancy. This is a very serious type of hypertension that develops only during pregnancy. It affects the whole body, and it can be very dangerous for you and your baby.  Gestational hypertension and preeclampsia usually go away within 6 weeks after your baby is born. Women who have hypertension during pregnancy have a greater chance  of developing hypertension later in life or during future pregnancies. What are the causes? The exact cause of hypertension is not known. What increases the risk? There are certain factors that make it more likely for you to develop hypertension during pregnancy. These include:  Having hypertension during a previous pregnancy or prior  to pregnancy.  Being overweight.  Being older than age 34.  Being pregnant for the first time or being pregnant with more than one baby.  Becoming pregnant using fertilization methods such as IVF (in vitro fertilization).  Having diabetes, kidney problems, or systemic lupus erythematosus.  Having a family history of hypertension.  What are the signs or symptoms? Chronic hypertension and gestational hypertension rarely cause symptoms. Preeclampsia causes symptoms, which may include:  Increased protein in your urine. Your health care provider will check for this at every visit before you give birth (prenatal visit).  Severe headaches.  Sudden weight gain.  Swelling of the hands, face, legs, and feet.  Nausea and vomiting.  Vision problems, such as blurred or double vision.  Numbness in the face, arms, legs, and feet.  Dizziness.  Slurred speech.  Sensitivity to bright lights.  Abdominal pain.  Convulsions.  How is this diagnosed? You may be diagnosed with hypertension during a routine prenatal exam. At each prenatal visit, you may:  Have a urine test to check for high amounts of protein in your urine.  Have your blood pressure checked. A blood pressure reading is recorded as two numbers, such as "120 over 80" (or 120/80). The first ("top") number is called the systolic pressure. It is a measure of the pressure in your arteries when your heart beats. The second ("bottom") number is called the diastolic pressure. It is a measure of the pressure in your arteries as your heart relaxes between beats. Blood pressure is measured in a unit called mm Hg. A normal blood pressure reading is: ? Systolic: below 120. ? Diastolic: below 80.  The type of hypertension that you are diagnosed with depends on your test results and when your symptoms developed.  Chronic hypertension is usually diagnosed before 20 weeks of pregnancy.  Gestational hypertension is usually diagnosed after  20 weeks of pregnancy.  Hypertension with high amounts of protein in the urine is diagnosed as preeclampsia.  Blood pressure measurements that stay above 160 systolic, or above 110 diastolic, are signs of severe preeclampsia.  How is this treated? Treatment for hypertension during pregnancy varies depending on the type of hypertension you have and how serious it is.  If you take medicines called ACE inhibitors to treat chronic hypertension, you may need to switch medicines. ACE inhibitors should not be taken during pregnancy.  If you have gestational hypertension, you may need to take blood pressure medicine.  If you are at risk for preeclampsia, your health care provider may recommend that you take a low-dose aspirin every day to prevent high blood pressure during your pregnancy.  If you have severe preeclampsia, you may need to be hospitalized so you and your baby can be monitored closely. You may also need to take medicine (magnesium sulfate) to prevent seizures and to lower blood pressure. This medicine may be given as an injection or through an IV tube.  In some cases, if your condition gets worse, you may need to deliver your baby early.  Follow these instructions at home: Eating and drinking  Drink enough fluid to keep your urine clear or pale yellow.  Eat a healthy diet that  is low in salt (sodium). Do not add salt to your food. Check food labels to see how much sodium a food or beverage contains. Lifestyle  Do not use any products that contain nicotine or tobacco, such as cigarettes and e-cigarettes. If you need help quitting, ask your health care provider.  Do not use alcohol.  Avoid caffeine.  Avoid stress as much as possible. Rest and get plenty of sleep. General instructions  Take over-the-counter and prescription medicines only as told by your health care provider.  While lying down, lie on your left side. This keeps pressure off your baby.  While sitting or  lying down, raise (elevate) your feet. Try putting some pillows under your lower legs.  Exercise regularly. Ask your health care provider what kinds of exercise are best for you.  Keep all prenatal and follow-up visits as told by your health care provider. This is important. Contact a health care provider if:  You have symptoms that your health care provider told you may require more treatment or monitoring, such as: ? Fever. ? Vomiting. ? Headache. Get help right away if:  You have severe abdominal pain or vomiting that does not get better with treatment.  You suddenly develop swelling in your hands, ankles, or face.  You gain 4 lbs (1.8 kg) or more in 1 week.  You develop vaginal bleeding, or you have blood in your urine.  You do not feel your baby moving as much as usual.  You have blurred or double vision.  You have muscle twitching or sudden tightening (spasms).  You have shortness of breath.  Your lips or fingernails turn blue. This information is not intended to replace advice given to you by your health care provider. Make sure you discuss any questions you have with your health care provider. Document Released: 04/28/2011 Document Revised: 02/28/2016 Document Reviewed: 01/24/2016 Elsevier Interactive Patient Education  2018 [employer]. Hypertension During Pregnancy Hypertension is also called high blood pressure. High blood pressure means that the force of your blood moving in your body is too strong. When you are pregnant, this condition should be watched carefully. It can cause problems for you and your baby. Follow these instructions at home: Eating and drinking  Drink enough fluid to keep your pee (urine) clear or pale yellow.  Eat healthy foods that are low in salt (sodium). ? Do not add salt to your food. ? Check labels on foods and drinks to see much salt is in them. Look on the label where you see "Sodium." Lifestyle  Do not use any products that  contain nicotine or tobacco, such as cigarettes and e-cigarettes. If you need help quitting, ask your doctor.  Do not use alcohol.  Avoid caffeine.  Avoid stress. Rest and get plenty of sleep. General instructions  Take over-the-counter and prescription medicines only as told by your doctor.  While lying down, lie on your left side. This keeps pressure off your baby.  While sitting or lying down, raise (elevate) your feet. Try putting some pillows under your lower legs.  Exercise regularly. Ask your doctor what kinds of exercise are best for you.  Keep all prenatal and follow-up visits as told by your doctor. This is important. Contact a doctor if:  You have symptoms that your doctor told you to watch for, such as: ? Fever. ? Throwing up (vomiting). ? Headache. Get help right away if:  You have very bad pain in your belly (abdomen).  You are throwing  up, and this does not get better with treatment.  You suddenly get swelling in your hands, ankles, or face.  You gain 4 lb (1.8 kg) or more in 1 week.  You get bleeding from your vagina.  You have blood in your pee.  You do not feel your baby moving as much as normal.  You have a change in vision.  You have muscle twitching or sudden tightening (spasms).  You have trouble breathing.  Your lips or fingernails turn blue. This information is not intended to replace advice given to you by your health care provider. Make sure you discuss any questions you have with your health care provider. Document Released: 09/12/2010 Document Revised: 04/21/2016 Document Reviewed: 04/21/2016 Elsevier Interactive Patient Education  [employer].

## 2017-08-12 NOTE — Plan of Care (Signed)
Discharge instructions, both oral and written, given to pt and family.  Pt agrees with plan of care of continuing 24 hour urine collection and to call or return to [facility] if any questions or concerns.  Pt leaving [facility] in stable condition ambulatory with family. Tracey Swanson RNC

## 2017-08-13 ENCOUNTER — Other Ambulatory Visit
Admission: RE | Admit: 2017-08-13 | Discharge: 2017-08-13 | Disposition: A | Discharge status: Home / Self Care | Payer: BLUE CROSS/BLUE SHIELD | Source: Ambulatory Visit | Attending: Obstetrics and Gynecology | Admitting: Obstetrics and Gynecology

## 2017-08-13 DIAGNOSIS — O1493 Unspecified pre-eclampsia, third trimester: Secondary | ICD-10-CM | POA: Diagnosis not present

## 2017-08-13 DIAGNOSIS — Z3A37 37 weeks gestation of pregnancy: Secondary | ICD-10-CM | POA: Insufficient documentation

## 2017-08-13 LAB — PROTEIN, URINE, 24 HOUR
Collection Interval-UPROT: 24 hours
PROTEIN, 24H URINE: 182 mg/d — AB (ref 50–100)
Protein, Urine: 14 mg/dL
Urine Total Volume-UPROT: 1300 mL

## 2017-09-01 ENCOUNTER — Other Ambulatory Visit: Payer: Self-pay

## 2017-09-01 ENCOUNTER — Inpatient Hospital Stay
Admission: EM | Admit: 2017-09-01 | Discharge: 2017-09-05 | DRG: 788 | Disposition: A | Discharge status: Home / Self Care | Payer: BLUE CROSS/BLUE SHIELD | Attending: Obstetrics and Gynecology | Admitting: Obstetrics and Gynecology

## 2017-09-01 DIAGNOSIS — E669 Obesity, unspecified: Secondary | ICD-10-CM | POA: Diagnosis present

## 2017-09-01 DIAGNOSIS — O99344 Other mental disorders complicating childbirth: Secondary | ICD-10-CM | POA: Diagnosis present

## 2017-09-01 DIAGNOSIS — K219 Gastro-esophageal reflux disease without esophagitis: Secondary | ICD-10-CM | POA: Diagnosis present

## 2017-09-01 DIAGNOSIS — Z98891 History of uterine scar from previous surgery: Secondary | ICD-10-CM

## 2017-09-01 DIAGNOSIS — O324XX0 Maternal care for high head at term, not applicable or unspecified: Secondary | ICD-10-CM | POA: Diagnosis present

## 2017-09-01 DIAGNOSIS — O9962 Diseases of the digestive system complicating childbirth: Secondary | ICD-10-CM | POA: Diagnosis present

## 2017-09-01 DIAGNOSIS — F419 Anxiety disorder, unspecified: Secondary | ICD-10-CM | POA: Diagnosis present

## 2017-09-01 DIAGNOSIS — O1493 Unspecified pre-eclampsia, third trimester: Secondary | ICD-10-CM | POA: Diagnosis present

## 2017-09-01 DIAGNOSIS — O1494 Unspecified pre-eclampsia, complicating childbirth: Secondary | ICD-10-CM | POA: Diagnosis present

## 2017-09-01 DIAGNOSIS — O99214 Obesity complicating childbirth: Secondary | ICD-10-CM | POA: Diagnosis present

## 2017-09-01 DIAGNOSIS — Z3A39 39 weeks gestation of pregnancy: Secondary | ICD-10-CM

## 2017-09-01 LAB — CBC
HCT: 37.2 % (ref 35.0–47.0)
HEMOGLOBIN: 12.3 g/dL (ref 12.0–16.0)
MCH: 26.2 pg (ref 26.0–34.0)
MCHC: 33.2 g/dL (ref 32.0–36.0)
MCV: 79.1 fL — ABNORMAL LOW (ref 80.0–100.0)
Platelets: 161 10*3/uL (ref 150–440)
RBC: 4.71 MIL/uL (ref 3.80–5.20)
RDW: 15.8 % — AB (ref 11.5–14.5)
WBC: 9.7 10*3/uL (ref 3.6–11.0)

## 2017-09-01 LAB — TYPE AND SCREEN
ABO/RH(D): A POS
ANTIBODY SCREEN: NEGATIVE

## 2017-09-01 LAB — URINALYSIS, ROUTINE W REFLEX MICROSCOPIC
Bilirubin Urine: NEGATIVE
Glucose, UA: >=500 mg/dL — AB
Hgb urine dipstick: NEGATIVE
Ketones, ur: NEGATIVE mg/dL
Leukocytes, UA: NEGATIVE
Nitrite: NEGATIVE
Protein, ur: 30 mg/dL — AB
SPECIFIC GRAVITY, URINE: 1.018 (ref 1.005–1.030)
pH: 6 (ref 5.0–8.0)

## 2017-09-01 LAB — PROTEIN / CREATININE RATIO, URINE
CREATININE, URINE: 99 mg/dL
Protein Creatinine Ratio: 0.31 mg/mg{Cre} — ABNORMAL HIGH (ref 0.00–0.15)
Total Protein, Urine: 31 mg/dL

## 2017-09-01 LAB — COMPREHENSIVE METABOLIC PANEL
ALBUMIN: 2.8 g/dL — AB (ref 3.5–5.0)
ALT: 16 U/L (ref 14–54)
ANION GAP: 8 (ref 5–15)
AST: 21 U/L (ref 15–41)
Alkaline Phosphatase: 200 U/L — ABNORMAL HIGH (ref 38–126)
BILIRUBIN TOTAL: 0.2 mg/dL — AB (ref 0.3–1.2)
BUN: 10 mg/dL (ref 6–20)
CO2: 22 mmol/L (ref 22–32)
Calcium: 8.4 mg/dL — ABNORMAL LOW (ref 8.9–10.3)
Chloride: 105 mmol/L (ref 101–111)
Creatinine, Ser: 0.46 mg/dL (ref 0.44–1.00)
GFR calc Af Amer: >60 mL/min (ref >60)
GFR calc non Af Amer: >60 mL/min (ref >60)
GLUCOSE: 127 mg/dL — AB (ref 65–99)
POTASSIUM: 4 mmol/L (ref 3.5–5.1)
Sodium: 135 mmol/L (ref 135–145)
TOTAL PROTEIN: 6.4 g/dL — AB (ref 6.5–8.1)

## 2017-09-01 LAB — GLUCOSE, CAPILLARY: Glucose-Capillary: 103 mg/dL — ABNORMAL HIGH (ref 65–99)

## 2017-09-01 MED ORDER — LACTATED RINGERS IV SOLN: 500.0000 mL | INTRAVENOUS | Status: DC | PRN

## 2017-09-01 MED ORDER — AMMONIA AROMATIC IN INHA
RESPIRATORY_TRACT | Status: AC
  Filled 2017-09-01: qty 10

## 2017-09-01 MED ORDER — OXYTOCIN 10 UNIT/ML IJ SOLN
INTRAMUSCULAR | Status: AC
  Filled 2017-09-01: qty 2

## 2017-09-01 MED ORDER — FAMOTIDINE 20 MG PO TABS
20.0000 mg | ORAL_TABLET | Freq: Every day | ORAL | Status: DC
  Administered 2017-09-01: 20 mg via ORAL
  Filled 2017-09-01: qty 1

## 2017-09-01 MED ORDER — ACETAMINOPHEN 325 MG PO TABS
650.0000 mg | ORAL_TABLET | ORAL | Status: DC | PRN
  Administered 2017-09-02 (×2): 650 mg via ORAL
  Filled 2017-09-01 (×2): qty 2

## 2017-09-01 MED ORDER — LACTATED RINGERS IV SOLN
INTRAVENOUS | Status: DC
  Administered 2017-09-01 – 2017-09-02 (×4): via INTRAVENOUS

## 2017-09-01 MED ORDER — OXYTOCIN 40 UNITS IN LACTATED RINGERS INFUSION - SIMPLE MED
2.5000 [IU]/h | INTRAVENOUS | Status: DC
  Administered 2017-09-03: 1000 mL via INTRAVENOUS

## 2017-09-01 MED ORDER — OXYTOCIN BOLUS FROM INFUSION: 500.0000 mL | Freq: Once | INTRAVENOUS | Status: DC

## 2017-09-01 MED ORDER — SOD CITRATE-CITRIC ACID 500-334 MG/5ML PO SOLN
30.0000 mL | ORAL | Status: DC | PRN
  Filled 2017-09-01: qty 15

## 2017-09-01 MED ORDER — OXYTOCIN 40 UNITS IN LACTATED RINGERS INFUSION - SIMPLE MED
1.0000 m[IU]/min | INTRAVENOUS | Status: DC
  Administered 2017-09-01: 2 m[IU]/min via INTRAVENOUS
  Filled 2017-09-01 (×2): qty 1000

## 2017-09-01 MED ORDER — LIDOCAINE HCL (PF) 1 % IJ SOLN: 30.0000 mL | INTRAMUSCULAR | Status: DC | PRN

## 2017-09-01 MED ORDER — MISOPROSTOL 200 MCG PO TABS
ORAL_TABLET | ORAL | Status: AC
  Filled 2017-09-01: qty 4

## 2017-09-01 MED ORDER — FENTANYL CITRATE (PF) 100 MCG/2ML IJ SOLN: 50.0000 ug | INTRAMUSCULAR | Status: DC | PRN

## 2017-09-01 MED ORDER — ONDANSETRON HCL 4 MG/2ML IJ SOLN
4.0000 mg | Freq: Four times a day (QID) | INTRAMUSCULAR | Status: DC | PRN
  Administered 2017-09-02: 4 mg via INTRAVENOUS
  Filled 2017-09-01: qty 2

## 2017-09-01 MED ORDER — TERBUTALINE SULFATE 1 MG/ML IJ SOLN: 0.2500 mg | Freq: Once | INTRAMUSCULAR | Status: DC | PRN

## 2017-09-01 MED ORDER — LIDOCAINE HCL (PF) 1 % IJ SOLN
INTRAMUSCULAR | Status: AC
  Filled 2017-09-01: qty 30

## 2017-09-01 NOTE — OB Triage Note (Signed)
Ms. Tracey Swanson sent from office for IOL due to elevated BP, headache (none now), swelling. Reports positive fetal movement, denies bleeding, LOF

## 2017-09-01 NOTE — H&P (Signed)
OB History & Physical   History of Present Illness:  Chief Complaint: headache and swelling, here for induction  HPI:  Tracey Swanson is a 37 y.o. G1P0 female at 7273w6d dated by 4936w4d US done at [facility].  LMP 12/01/16; Estimated Date of Delivery: 09/02/17  She presents to L&D for induction of labor due to pre-eclampsia   Active FM irreg mild ctx  Intact membranes bloody show: None    Pregnancy Issues: 1. Elevated BP with intermittent headaches, edema 2. Hx anxiety 3. Obesity, initial BMI 31, with excess weight gain >50lbs    Maternal Medical History:   Past Medical History:  Diagnosis Date  . Anxiety   . Carpal tunnel syndrome   . Carpal tunnel syndrome   . GERD (gastroesophageal reflux disease)     Past Surgical History:  Procedure Laterality Date  . BREAST BIOPSY Left 1998   surgical bx  . CARPAL TUNNEL RELEASE Bilateral 05/08/2015   Procedure: CARPAL TUNNEL RELEASE;  Surgeon: Deeann SaintHoward Miller, MD;  Location: [facility] ORS;  Service: Orthopedics;  Laterality: Bilateral;  . WISDOM TOOTH EXTRACTION      No Known Allergies  Prior to Admission medications   Medication Sig Start Date End Date Taking? Authorizing Provider  buPROPion (WELLBUTRIN) 75 MG tablet Take 75 mg by mouth 2 (two) times daily.   Yes [provider]  calcium carbonate (TUMS EX) 750 MG chewable tablet Chew 1 tablet by mouth daily.   Yes [provider]  folic acid (FOLVITE) 400 MCG tablet Take 400 mcg by mouth daily.   Yes [provider]  Prenatal Vit-Fe Fumarate-FA (PRENATAL MULTIVITAMIN) TABS tablet Take 1 tablet by mouth daily at 12 noon.   Yes [provider]  gabapentin (NEURONTIN) 400 MG capsule Take 1 capsule (400 mg total) by mouth 3 (three) times daily. Patient not taking: Reported on 03/08/2017 05/08/15   Deeann SaintMiller, Howard, MD  HYDROcodone-acetaminophen [employer](NORCO) 5-325 MG per tablet Take 1-2 tablets by mouth every 6 (six) hours as needed. Patient not taking: Reported on  03/08/2017 05/08/15   Deeann SaintMiller, Howard, MD  meloxicam (MOBIC) 15 MG tablet Take 1 tablet (15 mg total) by mouth daily. Patient not taking: Reported on 03/08/2017 05/08/15   Deeann SaintMiller, Howard, MD  omeprazole (PRILOSEC) 20 MG capsule Take 20 mg by mouth as needed.    [provider]  ondansetron (ZOFRAN) 4 MG tablet Take 1 tablet (4 mg total) by mouth daily as needed for nausea or vomiting. Patient not taking: Reported on 03/08/2017 01/12/16   Emily FilbertWilliams, Jonathan E, MD  sertraline (ZOLOFT) 50 MG tablet Take 50 mg by mouth as needed.    [provider]  traMADol (ULTRAM) 50 MG tablet Take 50 mg by mouth every 6 (six) hours as needed.    [provider]     [facility]   Social History: She  reports that  has never smoked. she has never used smokeless tobacco. She reports that she drinks alcohol. She reports that she does not use drugs.  Family History: family history includes Breast cancer in her maternal aunt; Breast cancer (age of onset: 5034) in her mother.   Review of Systems: A full review of systems was performed and negative except as noted in the HPI.     Physical Exam:  Vital Signs: BP 137/84 (BP Location: Left Arm)   Pulse 97   Temp 98.6 F (37 C) (Oral)   Resp 18   Ht 5\' 4"  (1.626 m)  Wt 230 lb (104.3 kg)   LMP 12/05/2016   BMI 39.48 kg/m  General: no acute distress.  HEENT: normocephalic, atraumatic Heart: regular rate & rhythm.  No murmurs/rubs/gallops Lungs: clear to auscultation bilaterally, normal respiratory effort Abdomen: soft, gravid, non-tender;  EFW: 7.5lbs Pelvic:   External: Normal external female genitalia  Cervix:  3 /  75/  -2; soft/posterior done in office per CNM    Extremities: non-tender, symmetric, 2+ edema bilaterally.  DTRs: 3+ no clonus Neurologic: Alert & oriented x 3.    No results found for this or any previous visit (from the past 24 hour(s)).  Pertinent Results:  Prenatal Labs: Blood  type/Rh A pos  Antibody screen neg  Rubella Immune  Varicella Immune  RPR NR  HBsAg Neg  HIV NR  GC neg  Chlamydia neg  Genetic screening negative  1 hour GTT  89  3 hour GTT  80-166-126-83  GBS  neg   FHT: 140bpm, mod variability, + accels, no decels TOCO: Irregular UCs SVE:    /   /    3/75/-2; soft/posterior   Cephalic by leopolds  No results found.  Assessment:  Tracey Swanson is a 37 y.o. G1P0 female at [redacted]w[redacted]d with IOL due to pre-eclampsia at [redacted]w[redacted]d.   Plan:  1. Admit to Labor & Delivery; consents reviewed and obtained  2. Fetal Well being  - Fetal Tracing: I - Group B Streptococcus ppx indicated: neg - Presentation: cephalic confirmed by exam   3. Routine OB: - Prenatal labs reviewed, as above - Rh A Pos - CBC, T&S, RPR on admit - Clear fluids, IVF  4. Pre-eclampsia - CMP, CBC and urine P/C ratio pending  5. Monitoring of Induction of Labor -  Contractions: external toco in place -  Pelvis unproven; adequate for labor -  Plan for induction with Pitocin -  Plan for continuous fetal monitoring  -  Maternal pain control as desired; discussed IVPM, nitrous, regional anesthesia - Anticipate vaginal delivery  6. Post Partum Planning: - Infant feeding: breast - Contraception: TBD  McVey, REBECCA A, CNM 09/01/17 6:04 PM

## 2017-09-01 NOTE — Progress Notes (Addendum)
Labor Progress Note  Tracey Swanson is a 37 y.o. G1P0 at 2865w6d by LMP admitted for induction of labor due to Pre-eclamptic toxemia of pregnancy..  Subjective: denies HA, VD or RUQ pain. Starting to feel contractions, 3/10 pain, "crampy"  Objective: BP 121/80   Pulse 96   Temp 98.4 F (36.9 C) (Oral)   Resp 16   Ht 5\' 4"  (1.626 m)   Wt 230 lb (104.3 kg)   LMP 12/05/2016   BMI 39.48 kg/m  Notable VS details: most BPs normotensive with occasional mild range.   Fetal Assessment: FHT:  FHR: 140 bpm, variability: moderate,  accelerations:  Present,  decelerations:  Absent Category/reactivity:  Category I UC:   irregular, every 2-8 minutes; pitocin currently at 128mu/min.  SVE:   Dilation: 3 Effacement (%): 70 Cervical Position: Posterior Station: -2 Presentation: Vertex Exam by:: R. Doren Kaspar, CNM  Cervix less posterior than earlier exam in office.   Membrane status:intact   Labs: Lab Results  Component Value Date   WBC 9.7 09/01/2017   HGB 12.3 09/01/2017   HCT 37.2 09/01/2017   MCV 79.1 (L) 09/01/2017   PLT 161 09/01/2017    Assessment / Plan: IOL at 4865w6d d/t pre-eclampsia  Noted glucosuria on UA and glucose 127 on CMP- will obtain fingerstick BS q4h x 2.   Labor: Progressing on Pitocin, will continue to increase then AROM Preeclampsia:  no signs or symptoms of toxicity, intake and ouput balanced and labs stable Fetal Wellbeing:  Category I Pain Control:  Labor support without medications I/D:  n/a Anticipated MOD:  NSVD  Graceanna Theissen A, CNM 09/01/2017, 9:05 PM

## 2017-09-02 ENCOUNTER — Inpatient Hospital Stay: Payer: BLUE CROSS/BLUE SHIELD | Admitting: Anesthesiology

## 2017-09-02 LAB — GLUCOSE, CAPILLARY
GLUCOSE-CAPILLARY: 92 mg/dL (ref 65–99)
Glucose-Capillary: 52 mg/dL — ABNORMAL LOW (ref 65–99)

## 2017-09-02 MED ORDER — DIPHENHYDRAMINE HCL 50 MG/ML IJ SOLN: 12.5000 mg | INTRAMUSCULAR | Status: DC | PRN

## 2017-09-02 MED ORDER — LIDOCAINE-EPINEPHRINE (PF) 1.5 %-1:200000 IJ SOLN
INTRAMUSCULAR | Status: DC | PRN
  Administered 2017-09-02: 3 mL via EPIDURAL

## 2017-09-02 MED ORDER — SODIUM CHLORIDE 0.9 % IV SOLN
INTRAVENOUS | Status: DC | PRN
  Administered 2017-09-02 (×2): 5 mL via EPIDURAL

## 2017-09-02 MED ORDER — PHENYLEPHRINE 40 MCG/ML (10ML) SYRINGE FOR IV PUSH (FOR BLOOD PRESSURE SUPPORT): 80.0000 ug | PREFILLED_SYRINGE | INTRAVENOUS | Status: DC | PRN

## 2017-09-02 MED ORDER — LIDOCAINE HCL (PF) 1 % IJ SOLN
INTRAMUSCULAR | Status: DC | PRN
  Administered 2017-09-02: 3 mL via SUBCUTANEOUS

## 2017-09-02 MED ORDER — FENTANYL 2.5 MCG/ML W/ROPIVACAINE 0.15% IN NS 100 ML EPIDURAL (ARMC)
EPIDURAL | Status: DC | PRN
  Administered 2017-09-02: 12 mL/h via EPIDURAL

## 2017-09-02 MED ORDER — SODIUM CHLORIDE FLUSH 0.9 % IV SOLN
INTRAVENOUS | Status: AC
  Filled 2017-09-02: qty 10

## 2017-09-02 MED ORDER — EPHEDRINE 5 MG/ML INJ
INTRAVENOUS | Status: AC
  Administered 2017-09-02: 10 mg via INTRAVENOUS
  Filled 2017-09-02: qty 4

## 2017-09-02 MED ORDER — FENTANYL 2.5 MCG/ML W/ROPIVACAINE 0.15% IN NS 100 ML EPIDURAL (ARMC)
12.0000 mL/h | EPIDURAL | Status: DC
  Administered 2017-09-02 – 2017-09-03 (×4): 12 mL/h via EPIDURAL
  Filled 2017-09-02 (×4): qty 100

## 2017-09-02 MED ORDER — EPHEDRINE 5 MG/ML INJ
10.0000 mg | INTRAVENOUS | Status: DC | PRN
  Administered 2017-09-02: 10 mg via INTRAVENOUS

## 2017-09-02 MED ORDER — LACTATED RINGERS IV SOLN: 500.0000 mL | Freq: Once | INTRAVENOUS | Status: DC

## 2017-09-02 MED ORDER — EPHEDRINE 5 MG/ML INJ: 10.0000 mg | INTRAVENOUS | Status: DC | PRN

## 2017-09-02 MED ORDER — FENTANYL 2.5 MCG/ML W/ROPIVACAINE 0.15% IN NS 100 ML EPIDURAL (ARMC)
EPIDURAL | Status: AC
  Filled 2017-09-02: qty 100

## 2017-09-02 NOTE — Progress Notes (Signed)
Wynelle Clevelandshley L Antony is a 37 y.o. G1P0 at 4919w0d  Subjective: "I just want to get some sleep and if I do not make anymore progress then I will do the C-section in the early am". I do not want a C-section now.   Objective: BP (!) 115/45   Pulse 98   Temp 98.7 F (37.1 C) (Oral)   Resp 18   Ht 5\' 4"  (1.626 m)   Wt 104.3 kg (230 lb)   LMP 12/05/2016   SpO2 96%   BMI 39.48 kg/m  I/O last 3 completed shifts: In: 3628.6 [I.V.:3628.6] Out: 1500 [Urine:1500] No intake/output data recorded.  FHT:  140, Cat 1, no decels UC: q 2-3 mins, MVU are inadequate and cannot get an adequate strip   SVE:   Dilation: 8.5 Effacement (%): 90 Station: -1, 0 Exam by:: Hurshel PartyE. Rivera, RN  US done and shows an OP position. Disc turning the baby with the peanut ball, side to side, continuing labor   Labs: Lab Results  Component Value Date   WBC 9.7 09/01/2017   HGB 12.3 09/01/2017   HCT 37.2 09/01/2017   MCV 79.1 (L) 09/01/2017   PLT 161 09/01/2017    Assessment / Plan: A:1. IUP at term for IOL for pre-ecclampsia 2. Arrest of dilation in the OP position P:1. Pt has not made progress in 4 hours. Disc with partner and pt that baby is in the OP position and labor is inadequate. Pt chose to continue vs. Having a LTCS now. Will stop the Pitocin x 1 hour and then re-titrate up. This may cause the labor to progress. Pt wants to sleep and will re-eval by 6am and if no further descent or labor progress, will call a LTCS. Disc risks of LTCS including anesthesia risks, bleeding, damage to internal organs and risk of bowel or bladder damage. Pt agrees to proceed with LTCS if not improved by early am.  Labor: Induction for pre-ecclampsia  Preeclampsia:  Without severe features Fetal Wellbeing:  Cat 1 Pain Control:  Epidural  I/D:  N/A Anticipated MOD:  NSVD vs LTCS Sharee PimpleCaron W Rane Blitch 09/02/2017, 11:32 PM

## 2017-09-02 NOTE — Progress Notes (Addendum)
Tracey Swanson is a 37 y.o. G1P0 at 5923w0d by US at [facility] admitted for Pre-ecclampsia without severe features. P:C ratio is 310.   Subjective: I am not feeling any pain after the epidural  Objective: BP 134/79   Pulse 91   Temp 98.3 F (36.8 C) (Oral)   Resp 16   Ht 5\' 4"  (1.626 m)   Wt 104.3 kg (230 lb)   LMP 12/05/2016   SpO2 93%   BMI 39.48 kg/m  No intake/output data recorded. Total I/O In: -  Out: 550 [Urine:550]  FHT: 140,+accels, no decels UC: q 5 mins  SVE:   Dilation: 5 Effacement (%): 80 Station: -1 Exam by:: R. McVey, CNM  Labs: Lab Results  Component Value Date   WBC 9.7 09/01/2017   HGB 12.3 09/01/2017   HCT 37.2 09/01/2017   MCV 79.1 (L) 09/01/2017   PLT 161 09/01/2017    Assessment / Plan:   Labor: induction for pre-ex Preeclampsia: no severe features  Fetal Wellbeing:  Reassuring Pain Control:  Epidural  I/D:  N/A Anticipated MOD:  SVD Pitocin at 18 mun/min, Dr Dalbert GarnetBeasley on unit and aware of pt status and agrees with plan. Sharee PimpleCaron W Jones 09/02/2017, 8:36 AM

## 2017-09-02 NOTE — Progress Notes (Signed)
Labor Progress Note  Tracey Swanson is a 37 y.o. G1P0 at 3332w0d LMP admitted for induction of labor due to Pre-eclampsia  Subjective: feeling contractions, 4/10, crampy and tight. Denies HA, VD or RUQ pain.   Objective: BP 125/79   Pulse 91   Temp 98.1 F (36.7 C) (Oral)   Resp 18   Ht 5\' 4"  (1.626 m)   Wt 230 lb (104.3 kg)   LMP 12/05/2016   BMI 39.48 kg/m  Notable VS details: reviewed.   Fetal Assessment: FHT:  145bpm, mod variability, + accels, no decels.  Category/reactivity:  Category I UC:   regular, every 3-4 minutes; Pitocin at 2620mu/min SVE: Dilation: 4 Effacement (%): 70, 80(75) Cervical Position: Posterior Station: -1 Presentation: Vertex Exam by:: R. McVey, CNM  Vertex well applied, with slightly bulging BOW, AROM for large amt clear fluid. Pt tol well.   Membrane status: .AROM clear; scant bloody show noted.    Labs: Lab Results  Component Value Date   WBC 9.7 09/01/2017   HGB 12.3 09/01/2017   HCT 37.2 09/01/2017   MCV 79.1 (L) 09/01/2017   PLT 161 09/01/2017    Assessment / Plan: IOL at 5532w0d d/t pre-eclampsia  1. glucosuria on UA and glucose 127 on CMP- obtained fingerstick BS q4h x 2- normal blood sugars noted.    Labor: Progressing on Pitocin, will continue to increase then AROM Preeclampsia:  no signs or symptoms of toxicity, intake and ouput balanced and labs stable Fetal Wellbeing:  Category I Pain Control:  Labor support without medications I/D:  n/a Anticipated MOD:  NSVD   McVey, REBECCA A, CNM 09/02/2017, 3:33 AM

## 2017-09-02 NOTE — Progress Notes (Addendum)
Spoke with provider, Eddie Candle. Jones,CNM, at 2335 to suggest a pitocin break and restarting her pitocin. Also updated provider that patient had oral temp of 99.7. Provider agreed and gave verbal order to stop pitocin for an hour and restart. Will give patient tylenol and recheck temperature in an hour. Will update provider of any changes.

## 2017-09-02 NOTE — Progress Notes (Signed)
Pitocin turned off at 2345. Tylenol 650mg  given at 2344.

## 2017-09-02 NOTE — Anesthesia Preprocedure Evaluation (Signed)
Anesthesia Evaluation  Patient identified by MRN, date of birth, ID band Patient awake    Reviewed: Allergy & Precautions, NPO status   History of Anesthesia Complications Negative for: history of anesthetic complications  Airway Mallampati: II       Dental  (+) Teeth Intact, Dental Advidsory Given   Pulmonary neg pulmonary ROS,           Cardiovascular Exercise Tolerance: Good hypertension (pre-eclampsia), (-) angina(-) dysrhythmias (-) Valvular Problems/Murmurs     Neuro/Psych negative neurological ROS  negative psych ROS   GI/Hepatic Neg liver ROS, GERD  Medicated,  Endo/Other  negative endocrine ROS  Renal/GU negative Renal ROS  negative genitourinary   Musculoskeletal negative musculoskeletal ROS (+)   Abdominal (+) + obese,   Peds negative pediatric ROS (+)  Hematology negative hematology ROS (+)   Anesthesia Other Findings   Reproductive/Obstetrics (+) Pregnancy                             Anesthesia Physical  Anesthesia Plan  ASA: II  Anesthesia Plan: Epidural   Post-op Pain Management:    Induction:   PONV Risk Score and Plan:   Airway Management Planned:   Additional Equipment:   Intra-op Plan:   Post-operative Plan:   Informed Consent: I have reviewed the patients History and Physical, chart, labs and discussed the procedure including the risks, benefits and alternatives for the proposed anesthesia with the patient or authorized representative who has indicated his/her understanding and acceptance.     Plan Discussed with: CRNA  Anesthesia Plan Comments:         Anesthesia Quick Evaluation

## 2017-09-02 NOTE — Progress Notes (Signed)
Labor Progress Note Tracey Swanson is a 37 y.o. G1P0 at 2037w0d LMPadmitted for induction of labor due toPre-eclampsia   Subjective: feeling good since epidural, able to sleep. Denies HA, VD or RUQ pain.   Objective: BP (!) 106/56   Pulse (!) 105   Temp 98 F (36.7 C) (Oral)   Resp 18   Ht 5\' 4"  (1.626 m)   Wt 230 lb (104.3 kg)   LMP 12/05/2016   SpO2 98%   BMI 39.48 kg/m  Notable VS details: reviewed  Fetal Assessment: FHT:  FHR: 140 bpm, variability: moderate,  accelerations:  Present,  decelerations:  Absent Category/reactivity:  Category I UC:   irregular, every 1.5-7 minutes; Pitocin at 6816mu/min SVE:   5/80/-1, soft/anterior.   Membrane status: Ruptured, clear, mod bloody show.   IPC and FSE placed without difficulty.   Labs: Lab Results  Component Value Date   WBC 9.7 09/01/2017   HGB 12.3 09/01/2017   HCT 37.2 09/01/2017   MCV 79.1 (L) 09/01/2017   PLT 161 09/01/2017    Assessment / Plan: Induction of labor due to preeclampsia,  progressing well on pitocin  Labor: minimal cervical change since last exam, internal monitors placed to assess MVUs. 1st 2 UCs with 60MVUs Preeclampsia:  no signs or symptoms of toxicity, intake and ouput balanced and labs stable Fetal Wellbeing:  Category I Pain Control:  Epidural I/D:  n/a Anticipated MOD:  NSVD  Talajah Slimp A, CNM 09/02/2017, 7:01 AM

## 2017-09-02 NOTE — Progress Notes (Signed)
Tracey Swanson is a 37 y.o. G1P0 at 706w0d by  admitted for IOL for pre-ecclampsia.   Subjective: "No pain noted"  Objective: BP 111/74   Pulse 90   Temp 99.3 F (37.4 C) (Oral)   Resp 18   Ht 5\' 4"  (1.626 m)   Wt 104.3 kg (230 lb)   LMP 12/05/2016   SpO2 96%   BMI 39.48 kg/m  No intake/output data recorded. Total I/O In: 474.3 [I.V.:474.3] Out: 550 [Urine:550]  FHT:  140, reactive, no decels, Cat 1 UC:  q 2-5 mins, MVU's 190,. Pitocin titrating back up.  SVE:   Dilation: 8 Effacement (%): 90 Station: -1 Exam by:: k. yates, rn  Labs: Lab Results  Component Value Date   WBC 9.7 09/01/2017   HGB 12.3 09/01/2017   HCT 37.2 09/01/2017   MCV 79.1 (L) 09/01/2017   PLT 161 09/01/2017    Assessment / Plan: A:1. Term pre with pre-ecclampsia 2. IOL for above P:1. Titrate Pitocin back up to get an effective pattern. 2. Continue epidural. 3. Continue to labor even if slow process. HOB elevated for Fowlers to enc baby coming down.  Dr Dalbert GarnetBeasley is aware of the situation and agrees with the management plan.   Labor:Progressing  Preeclampsia:  Without severe features Fetal Wellbeing: Cat 1 Pain Control:  Anticipated MOD Epidural : Vaginal expected  [name] 09/02/2017, 5:43 PM

## 2017-09-02 NOTE — Discharge Summary (Signed)
Obstetric Discharge Summary   Patient ID: Patient Name: Tracey Swanson DOB: 1981-08-13 MRN: [id number]  Date of Admission: 09/01/2017 Date of Delivery:09/03/2017 Delivered by: Dr. Leafy Ro and Lisette Grinder, CNM as Assist Date of Discharge:09/05/2017  Primary OB: [facility]  YCX:KGYJEHU'D last menstrual period was 12/05/2016. EDC Estimated Date of Delivery: 09/02/17 Gestational Age at Delivery: [redacted]w[redacted]d  Antepartum complications: Pre-ecclampsia without severe features Admitting Diagnosis: Term preg with pre-ecclampsia   Secondary Diagnoses: Patient Active Problem List   Diagnosis Date Noted  . S/P primary low transverse C-section 09/03/2017  . Pre-eclampsia, third trimester 09/01/2017  . Pregnancy 08/02/2017  . First trimester screening     Augmentation: IOL with Pitocin  Complications: Arrest of Descent  Intrapartum complications/course: Pt was induced for Pre-ecclampsia without severe features, no HA, no blurred vision, no RUQ pain, P:C ratio  pLTCS on 09/03/17 Delivery Type: LTCS Anesthesia: epidural Placenta: sponatneous Incision:Pfanansteil  Episiotomy: none  Newborn Data: Live born: female "HMallie Mussel Birth Weight:  8lbs5oz APGAR: 9,9  Newborn Delivery   Birth date/time:   Delivery type:       Postpartum Course  Patient had an uncomplicated postpartum course.  By time of discharge on POD#2, her pain was controlled on oral pain medications; she had appropriate lochia and was ambulating, voiding without difficulty and tolerating regular diet.  She was deemed stable for discharge to home.   Results for orders placed or performed during the hospital encounter of 09/01/17 (from the past 72 hour(s))  Glucose, capillary     Status: Abnormal   Collection Time: 09/03/17  7:59 AM  Result Value Ref Range   Glucose-Capillary 52 (L) 65 - 99 mg/dL  CBC     Status: Abnormal   Collection Time: 09/04/17  5:39 AM  Result Value Ref Range   WBC 14.2 (H) 3.6 - 11.0 K/uL   RBC 3.46 (L) 3.80 - 5.20 MIL/uL   Hemoglobin 9.0 (L) 12.0 - 16.0 g/dL   HCT 28.1 (L) 35.0 - 47.0 %   MCV 81.3 80.0 - 100.0 fL   MCH 25.9 (L) 26.0 - 34.0 pg   MCHC 31.8 (L) 32.0 - 36.0 g/dL   RDW 16.5 (H) 11.5 - 14.5 %   Platelets 138 (L) 150 - 440 K/uL    Comment: Performed at [facility] [address] [place]  [id number] Comprehensive metabolic panel     Status: Abnormal   Collection Time: 09/04/17  5:39 AM  Result Value Ref Range   Sodium 133 (L) 135 - 145 mmol/L   Potassium 3.6 3.5 - 5.1 mmol/L   Chloride 107 101 - 111 mmol/L   CO2 21 (L) 22 - 32 mmol/L   Glucose, Bld 107 (H) 65 - 99 mg/dL   BUN 10 6 - 20 mg/dL   Creatinine, Ser 0.73 0.44 - 1.00 mg/dL   Calcium 7.8 (L) 8.9 - 10.3 mg/dL   Total Protein 4.5 (L) 6.5 - 8.1 g/dL   Albumin 1.9 (L) 3.5 - 5.0 g/dL   AST 46 (H) 15 - 41 U/L   ALT 16 14 - 54 U/L   Alkaline Phosphatase 123 38 - 126 U/L   Total Bilirubin 0.5 0.3 - 1.2 mg/dL   GFR calc non Af Amer >60 >60 mL/min   GFR calc Af Amer >60 >60 mL/min    Comment: (NOTE) The eGFR has been calculated using the CKD EPI equation. This calculation has not been validated in all clinical situations. eGFR's persistently <60 mL/min signify  possible Chronic Kidney Disease.    Anion gap 5 5 - 15    Comment: Performed at [facility], [address] [place], [place] [address]  CBC     Status: Abnormal   Collection Time: 09/05/17  6:41 AM  Result Value Ref Range   WBC 11.8 (H) 3.6 - 11.0 K/uL   RBC 3.31 (L) 3.80 - 5.20 MIL/uL   Hemoglobin 8.7 (L) 12.0 - 16.0 g/dL   HCT 27.0 (L) 35.0 - 47.0 %   MCV 81.8 80.0 - 100.0 fL   MCH 26.4 26.0 - 34.0 pg   MCHC 32.2 32.0 - 36.0 g/dL   RDW 16.7 (H) 11.5 - 14.5 %   Platelets 170 150 - 440 K/uL    Comment: Performed at [facility], [address] [place],  [address]    Labs: CBC Latest Ref Rng & Units 09/05/2017 09/04/2017 09/01/2017  WBC 3.6 - 11.0 K/uL 11.8(H) 14.2(H) 9.7  Hemoglobin 12.0 -  16.0 g/dL 8.7(L) 9.0(L) 12.3  Hematocrit 35.0 - 47.0 % 27.0(L) 28.1(L) 37.2  Platelets 150 - 440 K/uL 170 138(L) 161   Pertinent Results:  Prenatal Labs: Blood type/Rh A pos  Antibody screen neg  Rubella Immune  Varicella Immune  RPR NR  HBsAg Neg  HIV NR  GC neg  Chlamydia neg  Genetic screening negative  1 hour GTT  89  3 hour GTT  80-166-126-83  GBS  neg   Physical exam:  BP 109/61 (BP Location: Right Arm)   Pulse 100   Temp 98.4 F (36.9 C) (Oral)   Resp 20   Ht '5\' 4"'$  (1.626 m)   Wt 104.3 kg (230 lb)   LMP 12/05/2016   SpO2 98%   Breastfeeding? Unknown   BMI 39.48 kg/m  General: alert and no distress HEART: S1S2, RRR, NO M/R/G LUNGS:CTA bilat, no W/R/R Lochia: appropriate Abdomen: soft, NT Uterine Fundus: firm, below umbilicus Incision: c/d/i, healing well, no significant drainage, no dehiscence, no significant erythema Extremities: No evidence of DVT seen on physical exam. No lower extremity edema.   Disposition: stable, discharge to home Baby Feeding: breastmilk Baby Disposition: home with mom  Contraception: micronor   Varicella given at discharge: Immune MMR given at discharge: Immune Rh Immune globulin given: n/a  Plan:  Tracey MCCLOUD was discharged to home in good condition. Follow-up appointment at [facility] with delivery provider in 2 weeks  Discharge Instructions: Per After Visit Summary. Activity: Advance as tolerated. Pelvic rest for 6 weeks.   Diet: Regular Discharge Medications: Allergies as of 09/05/2017   No Known Allergies     Medication List    STOP taking these medications   folic acid 366 MCG tablet Commonly known as:  FOLVITE   gabapentin 400 MG capsule Commonly known as:  NEURONTIN   HYDROcodone-acetaminophen 5-325 MG tablet Commonly known as:  NORCO   meloxicam 15 MG tablet Commonly known as:  MOBIC   traMADol 50 MG tablet Commonly known as:  ULTRAM     TAKE these medications   buPROPion 75  MG tablet Commonly known as:  WELLBUTRIN Take 75 mg by mouth 2 (two) times daily.   calcium carbonate 750 MG chewable tablet Commonly known as:  TUMS EX Chew 1 tablet by mouth daily.   docusate sodium 100 MG capsule Commonly known as:  COLACE Take 1 capsule (100 mg total) by mouth 2 (two) times daily.   ibuprofen 800 MG tablet Commonly known as:  ADVIL,MOTRIN  Take 1 tablet (800 mg total) by mouth every 8 (eight) hours as needed.   omeprazole 20 MG capsule Commonly known as:  PRILOSEC Take 20 mg by mouth as needed.   ondansetron 4 MG tablet Commonly known as:  ZOFRAN Take 1 tablet (4 mg total) by mouth daily as needed for nausea or vomiting.   oxyCODONE-acetaminophen 5-325 MG tablet Commonly known as:  ROXICET Take 1-2 tablets by mouth every 6 (six) hours as needed for severe pain.   prenatal multivitamin Tabs tablet Take 1 tablet by mouth daily at 12 noon.   sertraline 50 MG tablet Commonly known as:  ZOLOFT Take 50 mg by mouth as needed.      Outpatient follow up: 2 weeks post-op at [facility]   Signed:  Gwen Her Girolamo Lortie

## 2017-09-02 NOTE — Anesthesia Procedure Notes (Signed)
Epidural Patient location during procedure: OB Start time: 09/02/2017 3:31 AM End time: 09/02/2017 3:42 AM  Staffing Anesthesiologist: Lenard SimmerKarenz, Aeon Koors, MD Performed: anesthesiologist   Preanesthetic Checklist Completed: patient identified, site marked, surgical consent, pre-op evaluation, timeout performed, IV checked, risks and benefits discussed and monitors and equipment checked  Epidural Patient position: sitting Prep: ChloraPrep Patient monitoring: heart rate, continuous pulse ox and blood pressure Approach: midline Location: L3-L4 Injection technique: LOR saline  Needle:  Needle type: Tuohy  Needle gauge: 17 G Needle length: 9 cm and 9 Needle insertion depth: 6 cm Catheter type: closed end flexible Catheter size: 19 Gauge Catheter at skin depth: 11 cm Test dose: negative and 1.5% lidocaine with Epi 1:200 K  Assessment Sensory level: T10 Events: blood not aspirated, injection not painful, no injection resistance, negative IV test and no paresthesia  Additional Notes Pt. Evaluated and documentation done after procedure finished. Patient identified. Risks/Benefits/Options discussed with patient including but not limited to bleeding, infection, nerve damage, paralysis, failed block, incomplete pain control, headache, blood pressure changes, nausea, vomiting, reactions to medication both or allergic, itching and postpartum back pain. Confirmed with bedside nurse the patient's most recent platelet count. Confirmed with patient that they are not currently taking any anticoagulation, have any bleeding history or any family history of bleeding disorders. Patient expressed understanding and wished to proceed. All questions were answered. Sterile technique was used throughout the entire procedure. Please see nursing notes for vital signs. Test dose was given through epidural catheter and negative prior to continuing to dose epidural or start infusion. Warning signs of high block given to  the patient including shortness of breath, tingling/numbness in hands, complete motor block, or any concerning symptoms with instructions to call for help. Patient was given instructions on fall risk and not to get out of bed. All questions and concerns addressed with instructions to call with any issues or inadequate analgesia.   Patient tolerated the insertion well without immediate complications.Reason for block:procedure for pain

## 2017-09-02 NOTE — Progress Notes (Signed)
Patient ID: Tracey Swanson, female   DOB: [birth year], 37 y.o.   MRN: [id number] Patient's last menstrual period was 12/05/2016.  Estimated Date of Delivery: 09/02/17 Pt is being induced for Pre-ecclampsia without severe features. S: Pt is comfortable with epidural at this time. O: Vitals:   09/02/17 1233 09/02/17 1333  BP: 110/72 110/62  Pulse: 98 100  Resp:    Temp:    SpO2:    Gen:36 yo white female in NAD. Resp reg and non-labored. UC: q 5 mins, IUPC continues with Pitocin being halved to allow labor to restart. Receptors are saturated from 26 mun/min prior. Dr Dalbert GarnetBeasley is aware and agrees with 1/2 of Pitocin and re-titrating back up.

## 2017-09-03 ENCOUNTER — Encounter
Admission: EM | Disposition: A | Discharge status: Home / Self Care | Payer: Self-pay | Source: Home / Self Care | Attending: Obstetrics and Gynecology

## 2017-09-03 ENCOUNTER — Encounter: Payer: Self-pay | Admitting: Anesthesiology

## 2017-09-03 DIAGNOSIS — Z98891 History of uterine scar from previous surgery: Secondary | ICD-10-CM

## 2017-09-03 LAB — GLUCOSE, CAPILLARY: Glucose-Capillary: 52 mg/dL — ABNORMAL LOW (ref 65–99)

## 2017-09-03 LAB — RPR: RPR Ser Ql: NONREACTIVE

## 2017-09-03 SURGERY — Surgical Case
Anesthesia: Choice | Site: Abdomen | Wound class: Clean Contaminated

## 2017-09-03 MED ORDER — MEASLES, MUMPS & RUBELLA VAC ~~LOC~~ INJ: 0.5000 mL | INJECTION | Freq: Once | SUBCUTANEOUS | Status: DC

## 2017-09-03 MED ORDER — TETANUS-DIPHTH-ACELL PERTUSSIS 5-2.5-18.5 LF-MCG/0.5 IM SUSP: 0.5000 mL | Freq: Once | INTRAMUSCULAR | Status: DC

## 2017-09-03 MED ORDER — CARBOPROST TROMETHAMINE 250 MCG/ML IM SOLN
250.0000 ug | Freq: Once | INTRAMUSCULAR | Status: DC
  Filled 2017-09-03: qty 1

## 2017-09-03 MED ORDER — MELOXICAM 7.5 MG PO TABS
15.0000 mg | ORAL_TABLET | Freq: Every day | ORAL | Status: DC
  Administered 2017-09-04: 15 mg via ORAL
  Filled 2017-09-03 (×2): qty 2
  Filled 2017-09-03: qty 1

## 2017-09-03 MED ORDER — DEXTROSE 50 % IV SOLN
12.5000 g | Freq: Once | INTRAVENOUS | Status: AC
  Administered 2017-09-03: 12.5 g via INTRAVENOUS

## 2017-09-03 MED ORDER — FLEET ENEMA 7-19 GM/118ML RE ENEM: 1.0000 | ENEMA | Freq: Every day | RECTAL | Status: DC | PRN

## 2017-09-03 MED ORDER — SENNOSIDES-DOCUSATE SODIUM 8.6-50 MG PO TABS
2.0000 | ORAL_TABLET | ORAL | Status: DC
  Administered 2017-09-04 – 2017-09-05 (×2): 2 via ORAL
  Filled 2017-09-03 (×3): qty 2

## 2017-09-03 MED ORDER — OXYCODONE-ACETAMINOPHEN 5-325 MG PO TABS
1.0000 | ORAL_TABLET | ORAL | Status: DC | PRN
  Administered 2017-09-05: 1 via ORAL
  Filled 2017-09-03: qty 1

## 2017-09-03 MED ORDER — SODIUM CHLORIDE 0.9 % IJ SOLN
INTRAMUSCULAR | Status: AC
  Filled 2017-09-03: qty 50

## 2017-09-03 MED ORDER — CALCIUM CARBONATE ANTACID 500 MG PO CHEW
750.0000 mg | CHEWABLE_TABLET | Freq: Every day | ORAL | Status: DC
  Administered 2017-09-04 – 2017-09-05 (×2): 750 mg via ORAL
  Filled 2017-09-03 (×2): qty 4

## 2017-09-03 MED ORDER — GABAPENTIN 300 MG PO CAPS
400.0000 mg | ORAL_CAPSULE | Freq: Three times a day (TID) | ORAL | Status: DC
  Administered 2017-09-03 – 2017-09-04 (×2): 400 mg via ORAL
  Filled 2017-09-03 (×4): qty 1

## 2017-09-03 MED ORDER — FOLIC ACID 0.5 MG HALF TAB
500.0000 ug | ORAL_TABLET | Freq: Every day | ORAL | Status: DC
  Administered 2017-09-04 – 2017-09-05 (×2): 0.5 mg via ORAL
  Filled 2017-09-03 (×3): qty 1

## 2017-09-03 MED ORDER — DIPHENHYDRAMINE HCL 25 MG PO CAPS: 25.0000 mg | ORAL_CAPSULE | ORAL | Status: DC | PRN

## 2017-09-03 MED ORDER — WITCH HAZEL-GLYCERIN EX PADS
1.0000 | MEDICATED_PAD | CUTANEOUS | Status: DC | PRN
Start: 2017-09-03 — End: 2017-09-03

## 2017-09-03 MED ORDER — NALOXONE HCL 0.4 MG/ML IJ SOLN: 0.4000 mg | INTRAMUSCULAR | Status: DC | PRN

## 2017-09-03 MED ORDER — NALBUPHINE HCL 10 MG/ML IJ SOLN: 5.0000 mg | INTRAMUSCULAR | Status: DC | PRN

## 2017-09-03 MED ORDER — MEASLES, MUMPS & RUBELLA VAC ~~LOC~~ INJ
0.5000 mL | INJECTION | Freq: Once | SUBCUTANEOUS | Status: DC
  Filled 2017-09-03: qty 0.5

## 2017-09-03 MED ORDER — PANTOPRAZOLE SODIUM 40 MG PO TBEC
40.0000 mg | DELAYED_RELEASE_TABLET | Freq: Every day | ORAL | Status: DC
Start: 2017-09-03 — End: 2017-09-05
  Administered 2017-09-03: 40 mg via ORAL
  Filled 2017-09-03 (×3): qty 1

## 2017-09-03 MED ORDER — MENTHOL 3 MG MT LOZG
1.0000 | LOZENGE | OROMUCOSAL | Status: DC | PRN
  Filled 2017-09-03: qty 9

## 2017-09-03 MED ORDER — SIMETHICONE 80 MG PO CHEW: 80.0000 mg | CHEWABLE_TABLET | ORAL | Status: DC | PRN

## 2017-09-03 MED ORDER — SIMETHICONE 80 MG PO CHEW: 80.0000 mg | CHEWABLE_TABLET | ORAL | Status: DC

## 2017-09-03 MED ORDER — LACTATED RINGERS IV SOLN: INTRAVENOUS | Status: DC

## 2017-09-03 MED ORDER — KETOROLAC TROMETHAMINE 30 MG/ML IJ SOLN
30.0000 mg | Freq: Four times a day (QID) | INTRAMUSCULAR | Status: AC | PRN
  Administered 2017-09-03 – 2017-09-04 (×2): 30 mg via INTRAVENOUS
  Filled 2017-09-03 (×3): qty 1

## 2017-09-03 MED ORDER — SOD CITRATE-CITRIC ACID 500-334 MG/5ML PO SOLN
30.0000 mL | Freq: Once | ORAL | Status: AC
  Administered 2017-09-03: 30 mL via ORAL

## 2017-09-03 MED ORDER — ACETAMINOPHEN 325 MG PO TABS: 650.0000 mg | ORAL_TABLET | ORAL | Status: DC | PRN

## 2017-09-03 MED ORDER — BUPIVACAINE HCL (PF) 0.5 % IJ SOLN
INTRAMUSCULAR | Status: AC
  Filled 2017-09-03: qty 30

## 2017-09-03 MED ORDER — DEXTROSE 5 % IV SOLN
500.0000 mg | INTRAVENOUS | Status: AC
  Administered 2017-09-03: 500 mg via INTRAVENOUS
  Filled 2017-09-03: qty 500

## 2017-09-03 MED ORDER — DIBUCAINE 1 % RE OINT: 1.0000 "application " | TOPICAL_OINTMENT | RECTAL | Status: DC | PRN

## 2017-09-03 MED ORDER — BUPIVACAINE IN DEXTROSE 0.75-8.25 % IT SOLN
INTRATHECAL | Status: DC | PRN
  Administered 2017-09-03: 1.5 mL via INTRATHECAL

## 2017-09-03 MED ORDER — SENNOSIDES-DOCUSATE SODIUM 8.6-50 MG PO TABS: 2.0000 | ORAL_TABLET | ORAL | Status: DC

## 2017-09-03 MED ORDER — SIMETHICONE 80 MG PO CHEW
80.0000 mg | CHEWABLE_TABLET | ORAL | Status: DC
  Administered 2017-09-04 – 2017-09-05 (×2): 80 mg via ORAL

## 2017-09-03 MED ORDER — WITCH HAZEL-GLYCERIN EX PADS: 1.0000 "application " | MEDICATED_PAD | CUTANEOUS | Status: DC | PRN

## 2017-09-03 MED ORDER — PRENATAL MULTIVITAMIN CH
1.0000 | ORAL_TABLET | Freq: Every day | ORAL | Status: DC
  Administered 2017-09-04 – 2017-09-05 (×2): 1 via ORAL
  Filled 2017-09-03 (×2): qty 1

## 2017-09-03 MED ORDER — KETOROLAC TROMETHAMINE 30 MG/ML IJ SOLN: 30.0000 mg | Freq: Four times a day (QID) | INTRAMUSCULAR | Status: AC | PRN

## 2017-09-03 MED ORDER — PRENATAL MULTIVITAMIN CH: 1.0000 | ORAL_TABLET | Freq: Every day | ORAL | Status: DC

## 2017-09-03 MED ORDER — IBUPROFEN 600 MG PO TABS
600.0000 mg | ORAL_TABLET | Freq: Four times a day (QID) | ORAL | Status: DC
  Administered 2017-09-04 – 2017-09-05 (×5): 600 mg via ORAL
  Filled 2017-09-03 (×5): qty 1

## 2017-09-03 MED ORDER — MORPHINE SULFATE (PF) 0.5 MG/ML IJ SOLN
INTRAMUSCULAR | Status: AC
  Filled 2017-09-03: qty 10

## 2017-09-03 MED ORDER — BUPROPION HCL 75 MG PO TABS
75.0000 mg | ORAL_TABLET | Freq: Two times a day (BID) | ORAL | Status: DC
  Administered 2017-09-04 – 2017-09-05 (×3): 75 mg via ORAL
  Filled 2017-09-03 (×6): qty 1

## 2017-09-03 MED ORDER — SODIUM CHLORIDE 0.9 % IV BOLUS (SEPSIS)
500.0000 mL | Freq: Once | INTRAVENOUS | Status: AC
  Administered 2017-09-03: 500 mL via INTRAVENOUS

## 2017-09-03 MED ORDER — ONDANSETRON HCL 4 MG/2ML IJ SOLN
4.0000 mg | Freq: Once | INTRAMUSCULAR | Status: AC | PRN
  Administered 2017-09-03: 4 mg via INTRAVENOUS
  Filled 2017-09-03: qty 2

## 2017-09-03 MED ORDER — FENTANYL CITRATE (PF) 100 MCG/2ML IJ SOLN: 25.0000 ug | INTRAMUSCULAR | Status: DC | PRN

## 2017-09-03 MED ORDER — OXYTOCIN 40 UNITS IN LACTATED RINGERS INFUSION - SIMPLE MED
2.5000 [IU]/h | INTRAVENOUS | Status: AC
  Filled 2017-09-03: qty 1000

## 2017-09-03 MED ORDER — COCONUT OIL OIL
1.0000 "application " | TOPICAL_OIL | Status: DC | PRN
  Administered 2017-09-04: 1 via TOPICAL
  Filled 2017-09-03: qty 120

## 2017-09-03 MED ORDER — PHENYLEPHRINE HCL 10 MG/ML IJ SOLN
INTRAMUSCULAR | Status: DC | PRN
  Administered 2017-09-03 (×5): 100 ug via INTRAVENOUS
  Administered 2017-09-03: 200 ug via INTRAVENOUS
  Administered 2017-09-03 (×3): 100 ug via INTRAVENOUS

## 2017-09-03 MED ORDER — ONDANSETRON HCL 4 MG PO TABS: 4.0000 mg | ORAL_TABLET | Freq: Every day | ORAL | Status: DC | PRN

## 2017-09-03 MED ORDER — LACTATED RINGERS IV SOLN
INTRAVENOUS | Status: DC
  Administered 2017-09-04: 04:00:00 via INTRAVENOUS

## 2017-09-03 MED ORDER — OXYCODONE-ACETAMINOPHEN 5-325 MG PO TABS
2.0000 | ORAL_TABLET | ORAL | Status: DC | PRN
  Administered 2017-09-04 – 2017-09-05 (×2): 2 via ORAL
  Filled 2017-09-03 (×2): qty 2

## 2017-09-03 MED ORDER — SERTRALINE HCL 25 MG PO TABS
50.0000 mg | ORAL_TABLET | Freq: Every day | ORAL | Status: DC
  Filled 2017-09-03: qty 2

## 2017-09-03 MED ORDER — BISACODYL 10 MG RE SUPP
10.0000 mg | Freq: Every day | RECTAL | Status: DC | PRN
  Filled 2017-09-03: qty 1

## 2017-09-03 MED ORDER — DIPHENHYDRAMINE HCL 25 MG PO CAPS: 25.0000 mg | ORAL_CAPSULE | Freq: Four times a day (QID) | ORAL | Status: DC | PRN

## 2017-09-03 MED ORDER — METHYLERGONOVINE MALEATE 0.2 MG/ML IJ SOLN
INTRAMUSCULAR | Status: AC
  Filled 2017-09-03: qty 2

## 2017-09-03 MED ORDER — CARBOPROST TROMETHAMINE 250 MCG/ML IM SOLN
INTRAMUSCULAR | Status: AC
  Administered 2017-09-03: 09:00:00
  Filled 2017-09-03: qty 1

## 2017-09-03 MED ORDER — BUPIVACAINE LIPOSOME 1.3 % IJ SUSP
20.0000 mL | Freq: Once | INTRAMUSCULAR | Status: DC
  Filled 2017-09-03: qty 20

## 2017-09-03 MED ORDER — ONDANSETRON HCL 4 MG/2ML IJ SOLN
4.0000 mg | Freq: Three times a day (TID) | INTRAMUSCULAR | Status: DC | PRN
  Administered 2017-09-03: 4 mg via INTRAVENOUS
  Filled 2017-09-03: qty 2

## 2017-09-03 MED ORDER — ONDANSETRON HCL 4 MG/2ML IJ SOLN
INTRAMUSCULAR | Status: DC | PRN
  Administered 2017-09-03: 4 mg via INTRAVENOUS

## 2017-09-03 MED ORDER — BUPIVACAINE HCL 0.5 % IJ SOLN
INTRAMUSCULAR | Status: DC | PRN
  Administered 2017-09-03: 30 mL

## 2017-09-03 MED ORDER — MEPERIDINE HCL 25 MG/ML IJ SOLN: 6.2500 mg | INTRAMUSCULAR | Status: DC | PRN

## 2017-09-03 MED ORDER — SIMETHICONE 80 MG PO CHEW
80.0000 mg | CHEWABLE_TABLET | Freq: Three times a day (TID) | ORAL | Status: DC
  Administered 2017-09-04 – 2017-09-05 (×4): 80 mg via ORAL
  Filled 2017-09-03 (×5): qty 1

## 2017-09-03 MED ORDER — SIMETHICONE 80 MG PO CHEW: 80.0000 mg | CHEWABLE_TABLET | Freq: Three times a day (TID) | ORAL | Status: DC

## 2017-09-03 MED ORDER — DEXTROSE 50 % IV SOLN
INTRAVENOUS | Status: AC
  Filled 2017-09-03: qty 50

## 2017-09-03 MED ORDER — OXYTOCIN 40 UNITS IN LACTATED RINGERS INFUSION - SIMPLE MED: 2.5000 [IU]/h | INTRAVENOUS | Status: DC

## 2017-09-03 MED ORDER — DIPHENHYDRAMINE HCL 50 MG/ML IJ SOLN: 12.5000 mg | INTRAMUSCULAR | Status: DC | PRN

## 2017-09-03 MED ORDER — CEFAZOLIN SODIUM-DEXTROSE 2-4 GM/100ML-% IV SOLN
2.0000 g | Freq: Once | INTRAVENOUS | Status: AC
  Administered 2017-09-03: 2 g via INTRAVENOUS
  Filled 2017-09-03 (×2): qty 100

## 2017-09-03 MED ORDER — ACETAMINOPHEN 500 MG PO TABS
1000.0000 mg | ORAL_TABLET | Freq: Four times a day (QID) | ORAL | Status: AC
  Administered 2017-09-04 (×2): 1000 mg via ORAL
  Filled 2017-09-03 (×2): qty 2

## 2017-09-03 MED ORDER — MORPHINE SULFATE (PF) 0.5 MG/ML IJ SOLN
INTRAMUSCULAR | Status: DC | PRN
  Administered 2017-09-03: .2 mg via EPIDURAL

## 2017-09-03 MED ORDER — SODIUM CHLORIDE 0.9 % IV SOLN
INTRAVENOUS | Status: DC | PRN
  Administered 2017-09-03: 70 mL

## 2017-09-03 MED ORDER — DEXTROSE-NACL 5-0.9 % IV SOLN
INTRAVENOUS | Status: DC
  Administered 2017-09-03 (×2): via INTRAVENOUS

## 2017-09-03 MED ORDER — SODIUM CHLORIDE 0.9% FLUSH: 3.0000 mL | INTRAVENOUS | Status: DC | PRN

## 2017-09-03 SURGICAL SUPPLY — 26 items
BARRIER ADHS 3X4 INTERCEED (GAUZE/BANDAGES/DRESSINGS) ×2 IMPLANT
BRR ADH 4X3 ABS CNTRL BYND (GAUZE/BANDAGES/DRESSINGS) ×1
CANISTER SUCT 3000ML PPV (MISCELLANEOUS) ×3 IMPLANT
CHLORAPREP W/TINT 26ML (MISCELLANEOUS) ×3 IMPLANT
DRSG TELFA 3X8 NADH (GAUZE/BANDAGES/DRESSINGS) ×3 IMPLANT
ELECT REM PT RETURN 9FT ADLT (ELECTROSURGICAL) ×3
ELECTRODE REM PT RTRN 9FT ADLT (ELECTROSURGICAL) ×1 IMPLANT
GAUZE SPONGE 4X4 12PLY STRL (GAUZE/BANDAGES/DRESSINGS) ×3 IMPLANT
GOWN STRL REUS W/ TWL LRG LVL3 (GOWN DISPOSABLE) ×3 IMPLANT
GOWN STRL REUS W/TWL LRG LVL3 (GOWN DISPOSABLE) ×9
NS IRRIG 1000ML POUR BTL (IV SOLUTION) ×3 IMPLANT
PAD DRESSING TELFA 3X8 NADH (GAUZE/BANDAGES/DRESSINGS) ×1 IMPLANT
PAD OB MATERNITY 4.3X12.25 (PERSONAL CARE ITEMS) ×3 IMPLANT
PAD PREP 24X41 OB/GYN DISP (PERSONAL CARE ITEMS) ×3 IMPLANT
RTRCTR C-SECT PINK 25CM LRG (MISCELLANEOUS) ×2 IMPLANT
STAPLER INSORB 30 2030 C-SECTI (MISCELLANEOUS) ×2 IMPLANT
SUT MNCRL 4-0 (SUTURE) ×3
SUT MNCRL 4-0 27XMFL (SUTURE) ×1
SUT PDS AB 1 TP1 96 (SUTURE) ×1 IMPLANT
SUT PLAIN 2 0 XLH (SUTURE) ×1 IMPLANT
SUT PLAIN GUT 2-0 30 C14 SG823 (SUTURE)
SUT VIC AB 0 CT1 36 (SUTURE) ×7 IMPLANT
SUT VIC AB 3-0 SH 27 (SUTURE)
SUT VIC AB 3-0 SH 27X BRD (SUTURE) ×1 IMPLANT
SUTURE MNCRL 4-0 27XMF (SUTURE) IMPLANT
SUTURE PLN GUT2-0 30 C14 SG823 (SUTURE) ×1 IMPLANT

## 2017-09-03 NOTE — Op Note (Addendum)
  Cesarean Section Procedure Note  Date of procedure: 09/03/2017   Pre-operative Diagnosis: Intrauterine pregnancy at 4053w1d; induction of labor for preeclampsia without severe features; arrest of dilation at 8cm; uterine atony  Post-operative Diagnosis: same, delivered.  Procedure: Primary Low Transverse Cesarean Section through Pfannenstiel incision  Surgeon: Christeen DouglasBethany Rashon Westrup, MD  Assistant(s):  CNM Haviland  Anesthesia: Epidural anesthesia and Spinal anesthesia  Anesthesiologist: Berdine Addisonhomas, Mathai, MD Anesthesiologist: Yves Dillarroll, Paul, MD; Lenard SimmerKarenz, Andrew, MD; Naomie DeanKephart, William K, MD; Piscitello, Cleda MccreedyJoseph K, MD; Berdine Addisonhomas, Mathai, MD CRNA: Henrietta HooverSmith, Adam, CRNA; Karoline CaldwellStarr, Deana, CRNA  Estimated Blood Loss:  600         Drains: none         Total IV Fluids: 600ml  Urine Output: 300ml         Specimens: none         Complications:  None; patient tolerated the procedure well.         Disposition: PACU - hemodynamically stable.         Condition: stable  Findings:  A female infant "Curly RimHenry Jacob" in cephalic presentation. Amniotic fluid - Clear  Birth weight 8#5oz.  Apgars of 9 and 9 at one and five minutes respectively.  Intact placenta with a three-vessel cord.  Grossly normal uterus, tubes and ovaries bilaterally. No intraabdominal adhesions were noted.  Indications: failed induction and failure to progress: arrest of dilation  Procedure Details  The patient was taken to Operating Room, identified as the correct patient and the procedure verified as C-Section Delivery. A formal Time Out was held with all team members present and in agreement.  After induction of anesthesia, the patient was draped and prepped in the usual sterile manner. A Pfannenstiel skin incision was made and carried down through the subcutaneous tissue to the fascia. Fascial incision was made and extended transversely with the Mayo scissors. The fascia was separated from the underlying rectus tissue superiorly and  inferiorly. The peritoneum was identified and entered bluntly. Peritoneal incision was extended longitudinally. The utero-vesical peritoneal reflection was incised transversely and a bladder flap was created digitally. Alexis retractor used.  A low transverse hysterotomy was made. The fetus was delivered atraumatically. The umbilical cord was clamped x2 and cut and the infant was handed to the awaiting pediatricians. The placenta was removed intact and appeared normal, intact, and with a 3-vessel cord.   The uterus was exteriorized and cleared of all clot and debris. The hysterotomy was closed with running sutures of 0-Vicryl. A second imbricating layer was placed with the same suture. Uterine atony persisted, and 250mcg Hemabate given IM into the uterine fundus. The peritoneal cavity was cleared of all clots and debris. The uterus was returned to the abdomen.   The pelvis was irrigated and excellent hemostasis was noted. The fascia was then reapproximated with running sutures of 0 Vicryl. The subcutaneous tissue was reapproximated with running sutures of 0 Vicry. The skin was reapproximated with absorbable sutures. 20ml (in 30 of 0.5% bupivicaine and 50ml of NSS) of liposomal bupivicaine placed in the fascial and skin lines. Adhesion barrier used over uterine incision.  Instrument, sponge, and needle counts were correct prior to the abdominal closure and at the conclusion of the case.   The patient tolerated the procedure well and was transferred to the recovery room in stable condition.   Christeen DouglasBethany Mikaelah Trostle, MD 09/03/2017

## 2017-09-03 NOTE — Consult Note (Signed)
Neonatology Note:   Attendance at C-section:    I was asked by Dr. Beasley to attend this primary C/S at term due to FTP. The mother is a G1P0 A pos, GBS negative with IOL for pre-eclampsia, a history of anxiety (Wellbutrin, Zoloft), and obseity. ROM 32 hours prior to delivery, fluid clear. She was afebrile during labor. Mother got a dose of Zithromax and Ancef just before delivery. She also got D50 about 1 hour prior to delivery. Infant vigorous with good spontaneous cry and tone. Cord clamping was delayed for 45 seconds. Needed only minimal bulb suctioning. Ap 9/9. Lungs clear to ausc in DR. To CN to care of Pediatrician. Due to history of maternally administered D50 recently, will check a POCT glucose on infant at 1 hour.   Tracey Swanson C. Tracey Vanburen, MD   

## 2017-09-03 NOTE — Transfer of Care (Signed)
Immediate Anesthesia Transfer of Care Note  Patient: Tracey Swanson  Procedure(s) Performed: CESAREAN SECTION (N/A Abdomen)  Patient Location: PACU and Mother/Baby  Anesthesia Type:Spinal  Level of Consciousness: awake, alert , oriented and patient cooperative  Airway & Oxygen Therapy: Patient Spontanous Breathing  Post-op Assessment: Report given to RN, Post -op Vital signs reviewed and stable and Patient moving all extremities  Post vital signs: Reviewed and stable  Last Vitals:  Vitals:   09/03/17 0715 09/03/17 0953  BP: 120/72 122/81  Pulse: 98 (!) 101  Resp: 16   Temp: 37 C 36.6 C  SpO2:  95%    Last Pain:  Vitals:   09/03/17 0953  TempSrc: Temporal  PainSc:       Patients Stated Pain Goal: 3 (09/02/17 1924)  Complications: No apparent anesthesia complications

## 2017-09-03 NOTE — Lactation Note (Signed)
This note was copied from a baby's chart. Lactation Consultation Note  Patient Name: Boy Chari Manningshley Wolbert ZOXWR'UToday's Date: 09/03/2017 Reason for consult: Initial assessment;Mother's request   Maternal Data Has patient been taught Hand Expression?: Yes Does the patient have breastfeeding experience prior to this delivery?: Yes  Feeding Feeding Type: Breast Fed Length of feed: 5 min  LATCH Score Latch: Repeated attempts needed to sustain latch, nipple held in mouth throughout feeding, stimulation needed to elicit sucking reflex.  Audible Swallowing: A few with stimulation  Type of Nipple: Flat(could be swelling)  Comfort (Breast/Nipple): Soft / non-tender  Hold (Positioning): Full assist, staff holds infant at breast  LATCH Score: 5  Interventions Interventions: Breast feeding basics reviewed;Assisted with latch;Skin to skin;Hand express;Reverse pressure;Breast compression;Support pillows;Position options;Adjust position  Lactation Tools Discussed/Used Tools: Comfort gels   Consult Status Consult Status: Follow-up Date: 09/03/17 Follow-up type: In-patient Mom very exhausted and nauseous from delivery. LC will do more teaching with parents at bedside on MBU. Nipple shield was used due to possible swelling of areola. Mom says she doesn't know if her nipple comes out any more than it's present state. Baby was able to maintain suction only with LC sandwiching mom's breast. LC hand expressed colostrum in to baby's mouth before leaving to get a nipple shield. Upon return baby seemed satisfied after only a few suckles with NS on.    Burnadette PeterJaniya M Divit Stipp 09/03/2017, 11:54 AM

## 2017-09-03 NOTE — Progress Notes (Signed)
Pitocin restarted at 0055, dosage at 2. Temperature now 98.7 oral.

## 2017-09-03 NOTE — Progress Notes (Signed)
Pt to OR.

## 2017-09-03 NOTE — Anesthesia Post-op Follow-up Note (Signed)
Anesthesia QCDR form completed.        

## 2017-09-03 NOTE — Lactation Note (Signed)
This note was copied from a baby's chart. Lactation Consultation Note  Patient Name: Boy Chari Manningshley Sarate WUJWJ'XToday's Date: 09/03/2017 Reason for consult: Follow-up assessment   Maternal Data Has patient been taught Hand Expression?: Yes  Feeding Feeding Type: Breast Fed Length of feed: (Attempt)  LATCH Score Latch: Too sleepy or reluctant, no latch achieved, no sucking elicited.  Audible Swallowing: None  Type of Nipple: Everted at rest and after stimulation  Comfort (Breast/Nipple): Soft / non-tender  Hold (Positioning): Full assist, staff holds infant at breast  LATCH Score: 4  Interventions Interventions: Skin to skin;Assisted with latch;Breast feeding basics reviewed;Breast massage;Hand express;Adjust position;Support pillows;Position options  Lactation Tools Discussed/Used Tools: Nipple Shields Nipple shield size: 20 WIC Program: No Pump Review: Setup, frequency, and cleaning;Milk Storage Initiated by:: Wilfred Lacy(J Yaelis Scharfenberg) Date initiated:: 09/03/17   Consult Status Consult Status: Follow-up Date: 09/03/17 "Sherilyn CooterHenry" is very sleepy/mom exhausted. LC and RNs have tried every 45mins for almost 5hrs and baby hasn't eaten. LC tried to hand express colostrum, none. Mom hasn't rested since 09/01/08. LC suggested mom rest after doing 2hrs of skin to skin and try to pump in 1 hr if baby doesn't latch. Discussed with mom using donor milk to get over hump or formula. Parents will discuss and make a decision by 1700.   Burnadette PeterJaniya M Mehmet Scally 09/03/2017, 4:23 PM

## 2017-09-03 NOTE — Care Management (Signed)
Epidural not sufficient. Will place spinal, low dose.

## 2017-09-03 NOTE — Progress Notes (Signed)
36yo G1P0 at 40+1wks after iol for PreEclampsia with mild range blood pressures and headache. AROM at 0130 on 09/02/17, 32hrs ago. Internal monitoring present now.    The risks of cesarean section discussed with the patient included but were not limited to: bleeding which may require transfusion or reoperation; infection which may require antibiotics; injury to bowel, bladder, ureters or other surrounding organs; injury to the fetus; need for additional procedures including hysterectomy in the event of a life-threatening hemorrhage; placental abnormalities wth subsequent pregnancies, incisional problems, thromboembolic phenomenon and other postoperative/anesthesia complications. The patient concurred with the proposed plan, giving informed written consent for the procedure.   Patient has been NPO since yesterday and she will remain NPO for procedure. Anesthesia and OR aware. Preoperative prophylactic antibiotics and SCDs ordered on call to the OR.  To OR when ready.

## 2017-09-03 NOTE — Progress Notes (Signed)
Tracey Swanson is a 37 y.o. G1P0 at 1625w1d  admitted for pre-ecclampsia with IOL and an OP position that has not changed in descent or in cervical change. Pt was offerred LTCS last pm and wanted to attempt to labor all night with the inadequate pattern. Pt is now aware that baby has not made any further descent and pt is happy to proceed as she did get some sleep and feels ready for the surgery.  Subjective: "I feel better now after getting some sleep"  Objective: BP 127/86   Pulse (!) 115   Temp 98.6 F (37 C) (Oral)   Resp 16   Ht 5\' 4"  (1.626 m)   Wt 104.3 kg (230 lb)   LMP 12/05/2016   SpO2 96%   BMI 39.48 kg/m  I/O last 3 completed shifts: In: 3628.6 [I.V.:3628.6] Out: 1500 [Urine:1500] Total I/O In: -  Out: 650 [Urine:650]  FHT:  140, Cat 1 UC:   q  11/2 to 4 SVE:   Dilation: 8.5 Effacement (%): 90 Station: 0 Exam by:: Beatriz Stallion. Sharell Hilmer, CNM   Labs: Lab Results  Component Value Date   WBC 9.7 09/01/2017   HGB 12.3 09/01/2017   HCT 37.2 09/01/2017   MCV 79.1 (L) 09/01/2017   PLT 161 09/01/2017    Assessment / Plan: A:1. Arrest of Descent 2. IOL for pre-ecclampsia P: Pitocin was cut off at 0630am.  2. Epidural continued  3. LTCS called as non-emergent at 6722 (Dr Dalbert GarnetBeasley was called and agreed with proceeding with LTCS). OR called back and has emergency Bowel Obstruction to go now and it was discussed with Dr Dalbert GarnetBeasley and pt will follow or go when another team is available. GYN will be bumped next. Pt and her partner are in agreement with continuing to wait for the LTCS. Baby is a Cat 1 and her labor has spaced out with 25 mm contractions.  Sharee Pimplearon W.Nonna Renninger, RN, MSN, CNM, FNP  Labor:  Preeclampsia:  Fetal Wellbeing:   Pain Control:   I/D:   Anticipated MOD:  Sharee Pimplearon W Janye Maynor 09/03/2017, 6:15 AM

## 2017-09-03 NOTE — Anesthesia Procedure Notes (Signed)
Spinal  Patient location during procedure: OR Staffing Anesthesiologist: Ikenna Ohms, MD Performed: anesthesiologist  Preanesthetic Checklist Completed: patient identified, site marked, surgical consent, pre-op evaluation, timeout performed, IV checked and risks and benefits discussed Spinal Block Patient position: sitting Prep: ChloraPrep Patient monitoring: heart rate, cardiac monitor, continuous pulse ox and blood pressure Approach: midline Location: L3-4 Injection technique: single-shot Needle Needle type: Pencil-Tip  Needle gauge: 25 G Needle length: 9 cm Assessment Sensory level: T10     

## 2017-09-04 LAB — CBC
HEMATOCRIT: 28.1 % — AB (ref 35.0–47.0)
Hemoglobin: 9 g/dL — ABNORMAL LOW (ref 12.0–16.0)
MCH: 25.9 pg — ABNORMAL LOW (ref 26.0–34.0)
MCHC: 31.8 g/dL — AB (ref 32.0–36.0)
MCV: 81.3 fL (ref 80.0–100.0)
Platelets: 138 10*3/uL — ABNORMAL LOW (ref 150–440)
RBC: 3.46 MIL/uL — ABNORMAL LOW (ref 3.80–5.20)
RDW: 16.5 % — AB (ref 11.5–14.5)
WBC: 14.2 10*3/uL — AB (ref 3.6–11.0)

## 2017-09-04 LAB — COMPREHENSIVE METABOLIC PANEL
ALBUMIN: 1.9 g/dL — AB (ref 3.5–5.0)
ALT: 16 U/L (ref 14–54)
ANION GAP: 5 (ref 5–15)
AST: 46 U/L — ABNORMAL HIGH (ref 15–41)
Alkaline Phosphatase: 123 U/L (ref 38–126)
BILIRUBIN TOTAL: 0.5 mg/dL (ref 0.3–1.2)
BUN: 10 mg/dL (ref 6–20)
CHLORIDE: 107 mmol/L (ref 101–111)
CO2: 21 mmol/L — AB (ref 22–32)
Calcium: 7.8 mg/dL — ABNORMAL LOW (ref 8.9–10.3)
Creatinine, Ser: 0.73 mg/dL (ref 0.44–1.00)
GFR calc Af Amer: >60 mL/min (ref >60)
GFR calc non Af Amer: >60 mL/min (ref >60)
GLUCOSE: 107 mg/dL — AB (ref 65–99)
POTASSIUM: 3.6 mmol/L (ref 3.5–5.1)
SODIUM: 133 mmol/L — AB (ref 135–145)
TOTAL PROTEIN: 4.5 g/dL — AB (ref 6.5–8.1)

## 2017-09-04 MED ORDER — SODIUM CHLORIDE 0.9 % IV BOLUS (SEPSIS)
500.0000 mL | Freq: Once | INTRAVENOUS | Status: AC
  Administered 2017-09-04: 500 mL via INTRAVENOUS

## 2017-09-04 NOTE — Anesthesia Post-op Follow-up Note (Signed)
  Anesthesia Pain Follow-up Note  Patient: Tracey Swanson  Day #: 1  Date of Follow-up: 09/04/2017 Time: 8:04 PM  Last Vitals:  Vitals:   09/04/17 1224 09/04/17 1749  BP: (!) 114/58 127/67  Pulse: (!) 108 97  Resp:    Temp: 37.3 C 37.2 C  SpO2: 97% 100%    Level of Consciousness: alert  Pain: mild   Side Effects:None  Catheter Site Exam:clean, dry     Plan: D/C from anesthesia care at surgeon's request  Lenard SimmerAndrew Janeah Kovacich

## 2017-09-04 NOTE — Anesthesia Postprocedure Evaluation (Signed)
Anesthesia Post Note  Patient: Tracey Swanson  Procedure(s) Performed: CESAREAN SECTION (N/A Abdomen)  Patient location during evaluation: Mother Baby Anesthesia Type: Spinal Level of consciousness: oriented and awake and alert Pain management: pain level controlled Vital Signs Assessment: post-procedure vital signs reviewed and stable Respiratory status: spontaneous breathing, respiratory function stable and patient connected to nasal cannula oxygen Cardiovascular status: blood pressure returned to baseline and stable Postop Assessment: no headache, no backache and no apparent nausea or vomiting Anesthetic complications: no     Last Vitals:  Vitals:   09/04/17 1224 09/04/17 1749  BP: (!) 114/58 127/67  Pulse: (!) 108 97  Resp:    Temp: 37.3 C 37.2 C  SpO2: 97% 100%    Last Pain:  Vitals:   09/04/17 1822  TempSrc:   PainSc: 0-No pain                 Lenard SimmerAndrew Avamae Dehaan

## 2017-09-04 NOTE — Lactation Note (Signed)
This note was copied from a baby's chart. Lactation Consultation Note  Patient Name: Tracey Swanson WUJWJ'XToday's Date: 09/04/2017 Reason for consult: Follow-up assessment;Mother's request;Primapara   Maternal Data Formula Feeding for Exclusion: No Does the patient have breastfeeding experience prior to this delivery?: No Mom states this has been the best feeding she has had, shown how to apply the nipple shield properly, she was very sleepy yesterday and could not focus. Feeding Feeding Type: Breast Fed Length of feed: 15 min  LATCH Score Latch: Grasps breast easily, tongue down, lips flanged, rhythmical sucking.  Audible Swallowing: Spontaneous and intermittent  Type of Nipple: Everted at rest and after stimulation(thick around areola, infant unable to latch well,sucks tong )  Comfort (Breast/Nipple): Soft / non-tender  Hold (Positioning): Assistance needed to correctly position infant at breast and maintain latch.  LATCH Score: 9  Interventions Interventions: Assisted with latch;Hand express;Breast compression;Support pillows(requires nipple shield at present)  Discussed use of Lansinoh pump   Lactation Tools Discussed/Used Tools: Nipple Shields Nipple shield size: 20 WIC Program: No   Consult Status Consult Status: PRN Date: 09/04/17 Follow-up type: In-patient    Dyann KiefMarsha D Tom Macpherson 09/04/2017, 3:31 PM

## 2017-09-04 NOTE — Lactation Note (Signed)
This note was copied from a baby's chart. Lactation Consultation Note  Patient Name: Tracey Swanson ZOXWR'UToday's Date: 09/04/2017 Reason for consult: Follow-up assessment   Maternal Data Formula Feeding for Exclusion: No  Feeding Feeding Type: Breast Fed Length of feed: 15 min(right breast, without nipple shield) Able to latch without shield, areola less firm, nipple sl pinched after baby came off, mom will use shield as needed tonight, swallows heard at breast LATCH Score Latch: Grasps breast easily, tongue down, lips flanged, rhythmical sucking.(without shield)  Audible Swallowing: Spontaneous and intermittent  Type of Nipple: Everted at rest and after stimulation  Comfort (Breast/Nipple): Soft / non-tender  Hold (Positioning): Assistance needed to correctly position infant at breast and maintain latch.  LATCH Score: 9  Interventions Interventions: Assisted with latch;Breast compression;Support pillows  Lactation Tools Discussed/Used Tools: Nipple Shields Nipple shield size: 20   Consult Status Consult Status: Follow-up Date: 09/05/17 Follow-up type: In-patient    Dyann KiefMarsha D Chiana Wamser 09/04/2017, 5:35 PM

## 2017-09-04 NOTE — Progress Notes (Signed)
Subjective: Postpartum Day 1: Cesarean Delivery Patient reports  Objective: Vital signs in last 24 hours: Temp:  [97.6 F (36.4 C)-98.7 F (37.1 C)] 98.7 F (37.1 C) (01/12 0916) Pulse Rate:  [75-104] 97 (01/12 0916) Resp:  [14-20] 18 (01/12 0439) BP: (93-127)/(52-88) 109/67 (01/12 0916) SpO2:  [94 %-100 %] 99 % (01/12 0916)  Physical Exam:  General: alert and cooperative Lochia: appropriate Uterine Fundus: firm Incision: no significant drainage DVT Evaluation: No evidence of DVT seen on physical exam.  Recent Labs    09/01/17 1754 09/04/17 0539  HGB 12.3 9.0*  HCT 37.2 28.1*    Assessment/Plan: Status post Cesarean section. Doing well postoperatively.  Continue current care. Po pain meds  Tracey Swanson 09/04/2017, 11:41 AM

## 2017-09-05 LAB — CBC
HCT: 27 % — ABNORMAL LOW (ref 35.0–47.0)
HEMOGLOBIN: 8.7 g/dL — AB (ref 12.0–16.0)
MCH: 26.4 pg (ref 26.0–34.0)
MCHC: 32.2 g/dL (ref 32.0–36.0)
MCV: 81.8 fL (ref 80.0–100.0)
Platelets: 170 10*3/uL (ref 150–440)
RBC: 3.31 MIL/uL — AB (ref 3.80–5.20)
RDW: 16.7 % — ABNORMAL HIGH (ref 11.5–14.5)
WBC: 11.8 10*3/uL — ABNORMAL HIGH (ref 3.6–11.0)

## 2017-09-05 MED ORDER — IBUPROFEN 800 MG PO TABS: 800.0000 mg | ORAL_TABLET | Freq: Three times a day (TID) | ORAL | 0 refills | Status: AC | PRN

## 2017-09-05 MED ORDER — OXYCODONE-ACETAMINOPHEN 5-325 MG PO TABS: 1.0000 | ORAL_TABLET | Freq: Four times a day (QID) | ORAL | 0 refills | Status: AC | PRN

## 2017-09-05 MED ORDER — ONDANSETRON HCL 4 MG PO TABS: 4.0000 mg | ORAL_TABLET | Freq: Every day | ORAL | 0 refills | Status: AC | PRN

## 2017-09-05 MED ORDER — DOCUSATE SODIUM 100 MG PO CAPS: 100.0000 mg | ORAL_CAPSULE | Freq: Two times a day (BID) | ORAL | 2 refills | Status: AC

## 2017-09-05 MED ORDER — NORETHINDRONE 0.35 MG PO TABS: 1.0000 | ORAL_TABLET | Freq: Every day | ORAL | 11 refills | Status: AC

## 2017-09-05 NOTE — Progress Notes (Signed)
Reviewed D/C instructions with pt and family. Pt verbalized understanding of teaching. Discharged to home via W/C. Pt to schedule f/u appt.  

## 2017-09-06 ENCOUNTER — Encounter: Payer: Self-pay | Admitting: Obstetrics and Gynecology

## 2018-09-06 IMAGING — MG MM DIGITAL SCREENING BILAT W/ TOMO W/ CAD
8 of 14 series · 8 of 30 positions shown · non-contrast
Comparison: Previous exam(s).

CLINICAL DATA: Screening.

EXAM:
2D DIGITAL SCREENING BILATERAL MAMMOGRAM WITH CAD AND ADJUNCT TOMO

[L CC (1 of 2)]
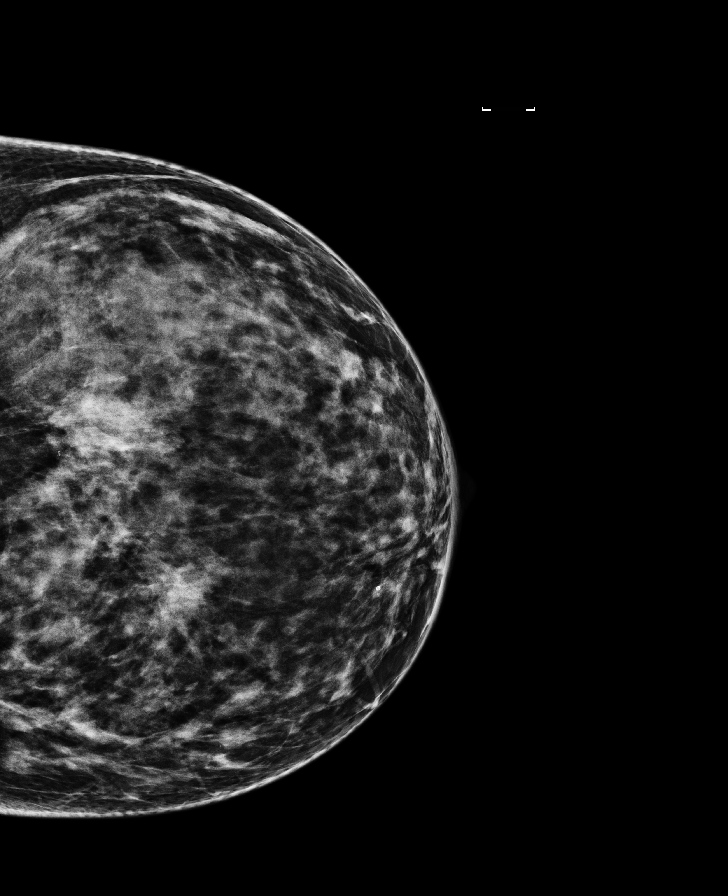

[R CC]
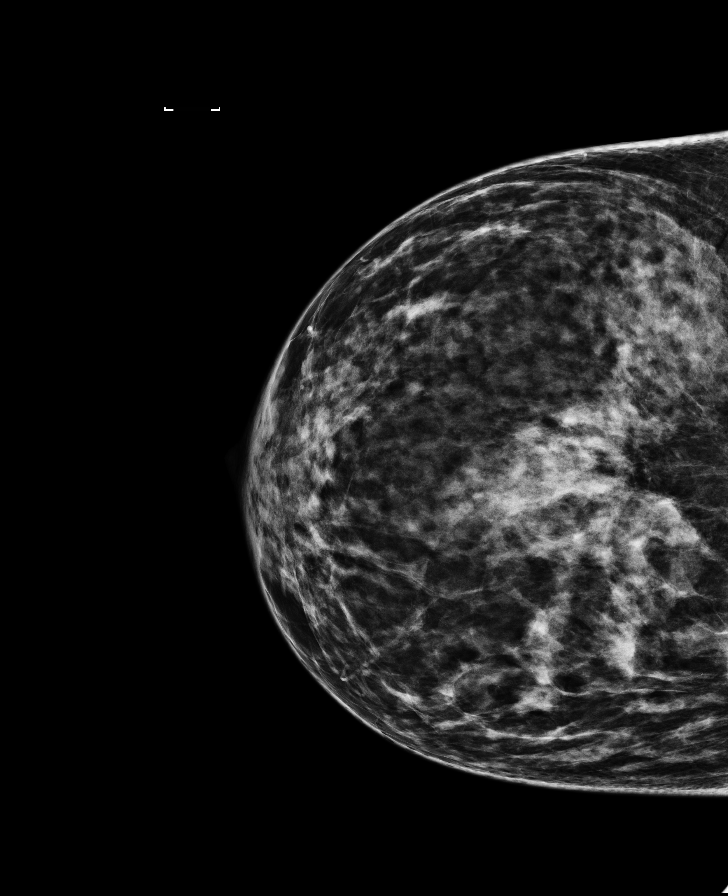

[L CC (2 of 2)]
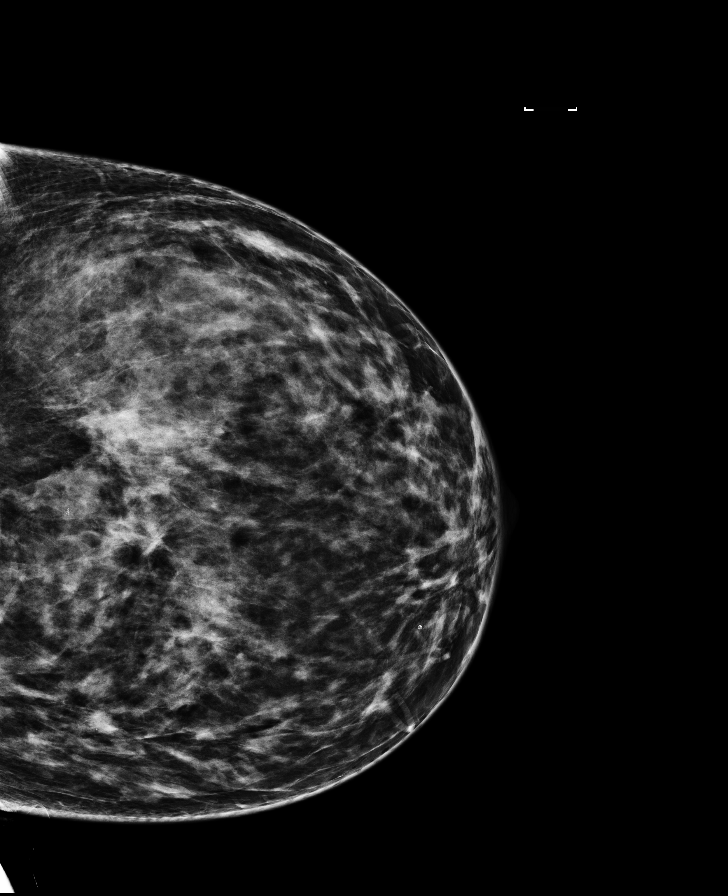

[L CC synth-2D]
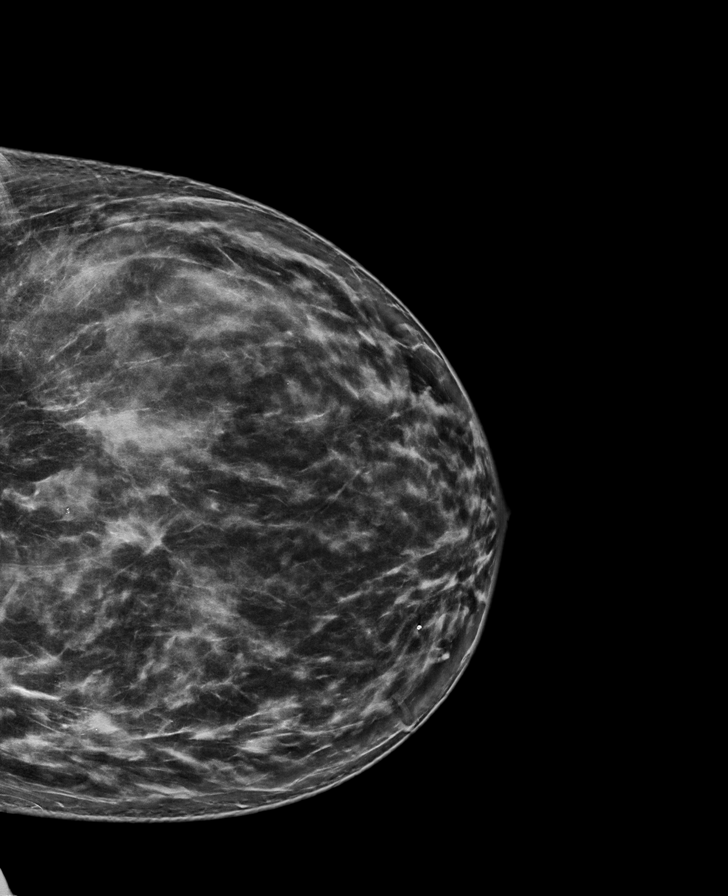

[R MLO]
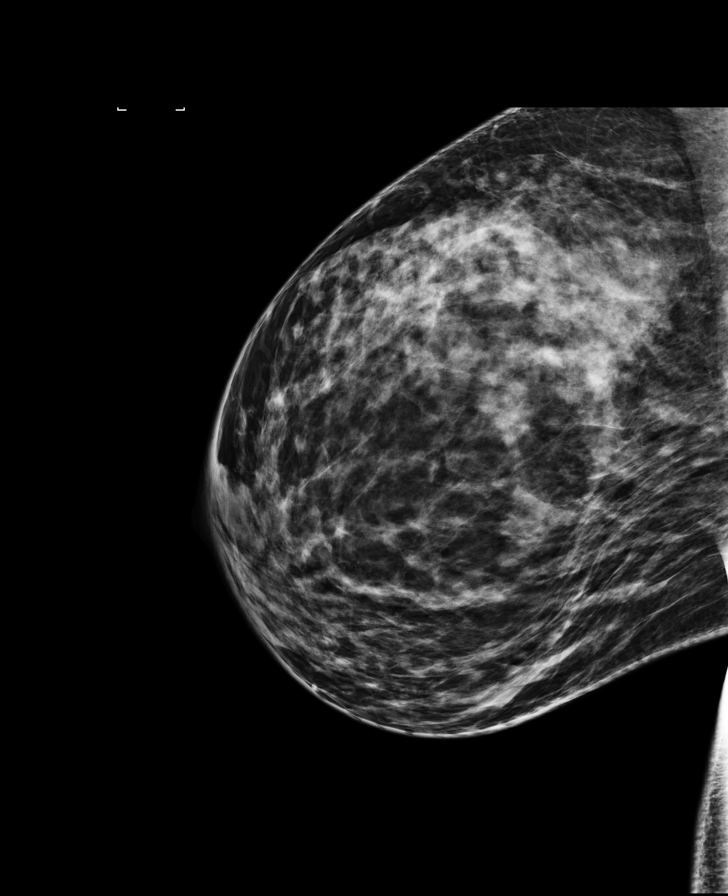

[R CC synth-2D]
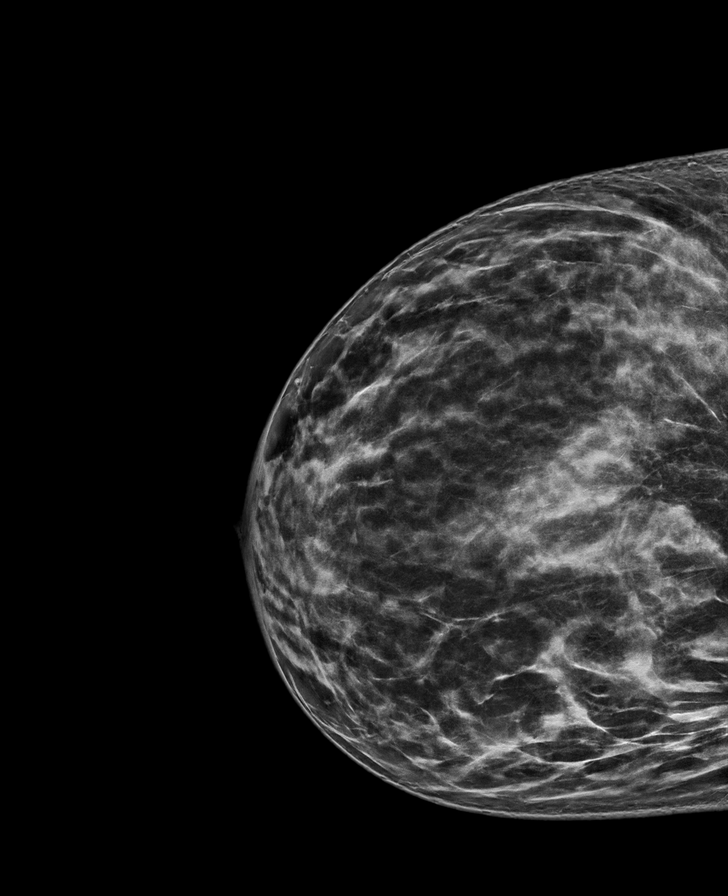

[L MLO]
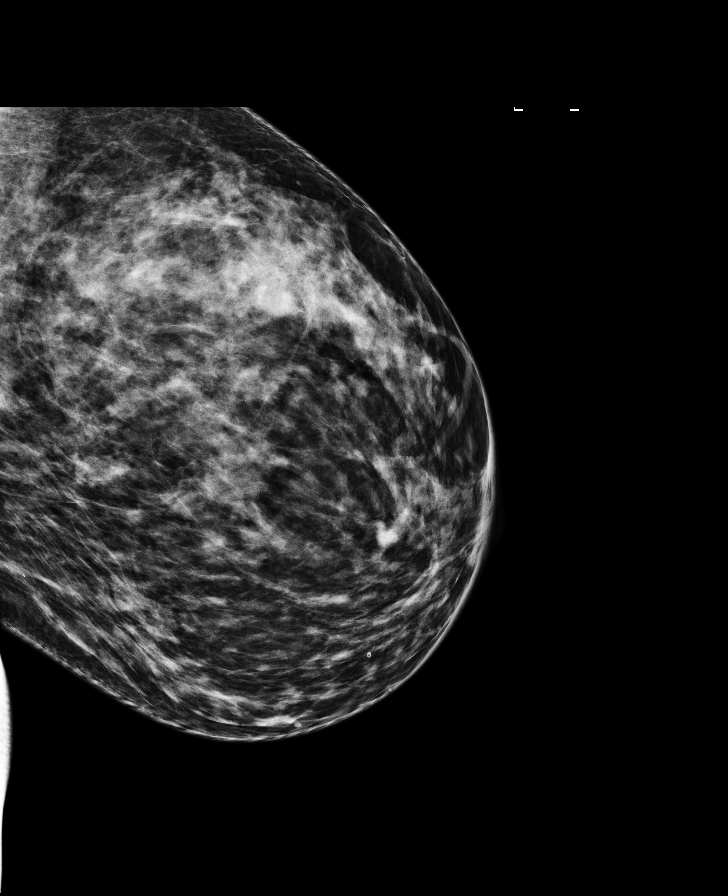

[R MLO synth-2D]
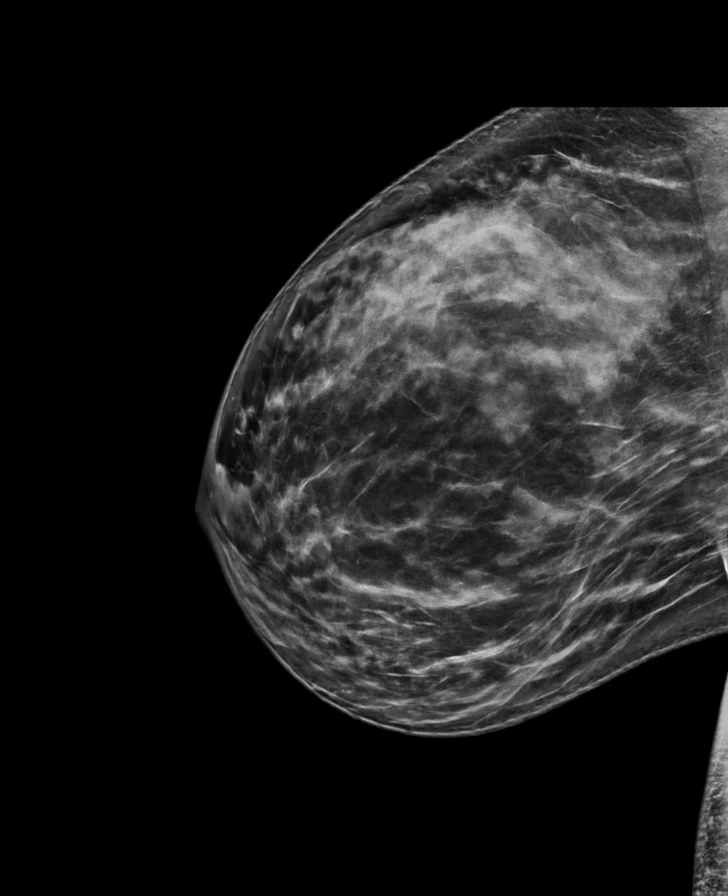

[8 of 30 positions shown; findings below may reference images not displayed]

ACR Breast Density Category c: The breast tissue is heterogeneously
dense, which may obscure small masses.
FINDINGS: There are no findings suspicious for malignancy. Images were
processed with CAD.
IMPRESSION: No mammographic evidence of malignancy. A result letter of this
screening mammogram will be mailed directly to the patient.

RECOMMENDATION:
1.    Screening mammogram at age 42. (Code:21-U-YSB)

2. Consider genetics counseling for this patient if not already
performed, given the family history of breast cancer in her mother
at age 34 and in her maternal aunt. Per [employer]
guidelines, if the patient has a calculated lifetime risk of
developing breast cancer of greater than 20%, annual screening MRI
of the breasts would be recommended at the time of screening
mammography.

BI-RADS CATEGORY  1: Negative.

## 2021-01-08 ENCOUNTER — Other Ambulatory Visit: Payer: Self-pay | Admitting: Certified Nurse Midwife

## 2021-01-08 DIAGNOSIS — Z1231 Encounter for screening mammogram for malignant neoplasm of breast: Secondary | ICD-10-CM

## 2021-02-13 ENCOUNTER — Other Ambulatory Visit: Payer: Self-pay

## 2021-02-13 ENCOUNTER — Ambulatory Visit
Admission: RE | Admit: 2021-02-13 | Discharge: 2021-02-13 | Disposition: A | Discharge status: Home / Self Care | Payer: 59 | Source: Ambulatory Visit | Attending: Certified Nurse Midwife | Admitting: Certified Nurse Midwife

## 2021-02-13 DIAGNOSIS — Z1231 Encounter for screening mammogram for malignant neoplasm of breast: Secondary | ICD-10-CM | POA: Diagnosis present

## 2021-02-19 ENCOUNTER — Other Ambulatory Visit: Payer: Self-pay | Admitting: Certified Nurse Midwife

## 2021-02-19 DIAGNOSIS — N6489 Other specified disorders of breast: Secondary | ICD-10-CM

## 2021-02-19 DIAGNOSIS — R928 Other abnormal and inconclusive findings on diagnostic imaging of breast: Secondary | ICD-10-CM

## 2021-02-25 ENCOUNTER — Ambulatory Visit
Admission: RE | Admit: 2021-02-25 | Discharge: 2021-02-25 | Disposition: A | Discharge status: Home / Self Care | Payer: 59 | Source: Ambulatory Visit | Attending: Certified Nurse Midwife | Admitting: Certified Nurse Midwife

## 2021-02-25 ENCOUNTER — Other Ambulatory Visit: Payer: Self-pay

## 2021-02-25 DIAGNOSIS — R928 Other abnormal and inconclusive findings on diagnostic imaging of breast: Secondary | ICD-10-CM | POA: Insufficient documentation

## 2021-02-25 DIAGNOSIS — N6489 Other specified disorders of breast: Secondary | ICD-10-CM | POA: Insufficient documentation

## 2021-08-14 ENCOUNTER — Other Ambulatory Visit: Payer: Self-pay | Admitting: Certified Nurse Midwife

## 2021-08-14 DIAGNOSIS — N6489 Other specified disorders of breast: Secondary | ICD-10-CM

## 2021-09-02 ENCOUNTER — Ambulatory Visit
Admission: RE | Admit: 2021-09-02 | Discharge: 2021-09-02 | Disposition: A | Discharge status: Home / Self Care | Payer: 59 | Source: Ambulatory Visit | Attending: Certified Nurse Midwife | Admitting: Certified Nurse Midwife

## 2021-09-02 ENCOUNTER — Other Ambulatory Visit: Payer: Self-pay

## 2021-09-02 DIAGNOSIS — N6489 Other specified disorders of breast: Secondary | ICD-10-CM

## 2022-10-13 ENCOUNTER — Other Ambulatory Visit: Payer: Self-pay | Admitting: Certified Nurse Midwife

## 2022-10-13 DIAGNOSIS — Z1231 Encounter for screening mammogram for malignant neoplasm of breast: Secondary | ICD-10-CM

## 2022-10-28 ENCOUNTER — Encounter: Payer: Self-pay | Admitting: Radiology

## 2022-10-28 ENCOUNTER — Ambulatory Visit
Admission: RE | Admit: 2022-10-28 | Discharge: 2022-10-28 | Disposition: A | Discharge status: Home / Self Care | Payer: 59 | Source: Ambulatory Visit | Attending: Certified Nurse Midwife | Admitting: Certified Nurse Midwife

## 2022-10-28 DIAGNOSIS — Z1231 Encounter for screening mammogram for malignant neoplasm of breast: Secondary | ICD-10-CM | POA: Diagnosis not present

## 2022-11-05 ENCOUNTER — Other Ambulatory Visit: Payer: Self-pay | Admitting: Certified Nurse Midwife

## 2022-11-05 DIAGNOSIS — R928 Other abnormal and inconclusive findings on diagnostic imaging of breast: Secondary | ICD-10-CM

## 2022-11-05 DIAGNOSIS — N63 Unspecified lump in unspecified breast: Secondary | ICD-10-CM

## 2022-11-10 ENCOUNTER — Ambulatory Visit
Admission: RE | Admit: 2022-11-10 | Discharge: 2022-11-10 | Disposition: A | Discharge status: Home / Self Care | Payer: 59 | Source: Ambulatory Visit | Attending: Certified Nurse Midwife | Admitting: Certified Nurse Midwife

## 2022-11-10 DIAGNOSIS — N63 Unspecified lump in unspecified breast: Secondary | ICD-10-CM | POA: Insufficient documentation

## 2022-11-10 DIAGNOSIS — R928 Other abnormal and inconclusive findings on diagnostic imaging of breast: Secondary | ICD-10-CM | POA: Diagnosis not present

## 2023-07-12 DIAGNOSIS — Z6832 Body mass index (BMI) 32.0-32.9, adult: Secondary | ICD-10-CM | POA: Diagnosis not present

## 2023-07-12 DIAGNOSIS — R030 Elevated blood-pressure reading, without diagnosis of hypertension: Secondary | ICD-10-CM | POA: Diagnosis not present

## 2023-07-12 DIAGNOSIS — J0140 Acute pansinusitis, unspecified: Secondary | ICD-10-CM | POA: Diagnosis not present

## 2023-07-12 DIAGNOSIS — H10022 Other mucopurulent conjunctivitis, left eye: Secondary | ICD-10-CM | POA: Diagnosis not present

## 2023-07-25 DIAGNOSIS — J0111 Acute recurrent frontal sinusitis: Secondary | ICD-10-CM | POA: Diagnosis not present

## 2023-07-25 DIAGNOSIS — R030 Elevated blood-pressure reading, without diagnosis of hypertension: Secondary | ICD-10-CM | POA: Diagnosis not present

## 2023-07-25 DIAGNOSIS — Z6832 Body mass index (BMI) 32.0-32.9, adult: Secondary | ICD-10-CM | POA: Diagnosis not present

## 2023-07-25 DIAGNOSIS — Z1152 Encounter for screening for COVID-19: Secondary | ICD-10-CM | POA: Diagnosis not present

## 2023-07-25 DIAGNOSIS — Z20822 Contact with and (suspected) exposure to covid-19: Secondary | ICD-10-CM | POA: Diagnosis not present

## 2023-07-25 DIAGNOSIS — J029 Acute pharyngitis, unspecified: Secondary | ICD-10-CM | POA: Diagnosis not present

## 2023-09-22 ENCOUNTER — Other Ambulatory Visit: Payer: Self-pay | Admitting: Certified Nurse Midwife

## 2023-09-22 DIAGNOSIS — Z131 Encounter for screening for diabetes mellitus: Secondary | ICD-10-CM | POA: Diagnosis not present

## 2023-09-22 DIAGNOSIS — R5382 Chronic fatigue, unspecified: Secondary | ICD-10-CM | POA: Diagnosis not present

## 2023-09-22 DIAGNOSIS — Z13220 Encounter for screening for lipoid disorders: Secondary | ICD-10-CM | POA: Diagnosis not present

## 2023-09-22 DIAGNOSIS — Z01411 Encounter for gynecological examination (general) (routine) with abnormal findings: Secondary | ICD-10-CM | POA: Diagnosis not present

## 2023-09-22 DIAGNOSIS — Z124 Encounter for screening for malignant neoplasm of cervix: Secondary | ICD-10-CM | POA: Diagnosis not present

## 2023-09-22 DIAGNOSIS — Z1231 Encounter for screening mammogram for malignant neoplasm of breast: Secondary | ICD-10-CM

## 2023-10-27 ENCOUNTER — Other Ambulatory Visit: Payer: Self-pay | Admitting: Family Medicine

## 2023-10-27 DIAGNOSIS — R748 Abnormal levels of other serum enzymes: Secondary | ICD-10-CM

## 2023-10-27 DIAGNOSIS — R609 Edema, unspecified: Secondary | ICD-10-CM

## 2023-10-29 ENCOUNTER — Ambulatory Visit
Admission: RE | Admit: 2023-10-29 | Discharge: 2023-10-29 | Disposition: A | Discharge status: Home / Self Care | Payer: 59 | Source: Ambulatory Visit | Attending: Certified Nurse Midwife | Admitting: Certified Nurse Midwife

## 2023-10-29 DIAGNOSIS — Z1231 Encounter for screening mammogram for malignant neoplasm of breast: Secondary | ICD-10-CM | POA: Diagnosis not present

## 2023-11-01 ENCOUNTER — Ambulatory Visit
Admission: RE | Admit: 2023-11-01 | Discharge: 2023-11-01 | Disposition: A | Discharge status: Home / Self Care | Payer: Self-pay | Source: Ambulatory Visit | Attending: Family Medicine | Admitting: Family Medicine

## 2023-11-01 DIAGNOSIS — R609 Edema, unspecified: Secondary | ICD-10-CM | POA: Insufficient documentation

## 2023-11-01 DIAGNOSIS — R7989 Other specified abnormal findings of blood chemistry: Secondary | ICD-10-CM | POA: Diagnosis not present

## 2023-11-01 DIAGNOSIS — R748 Abnormal levels of other serum enzymes: Secondary | ICD-10-CM | POA: Insufficient documentation

## 2023-12-02 DIAGNOSIS — Z7689 Persons encountering health services in other specified circumstances: Secondary | ICD-10-CM | POA: Diagnosis not present

## 2023-12-02 DIAGNOSIS — Z1339 Encounter for screening examination for other mental health and behavioral disorders: Secondary | ICD-10-CM | POA: Diagnosis not present

## 2023-12-02 DIAGNOSIS — Z1331 Encounter for screening for depression: Secondary | ICD-10-CM | POA: Diagnosis not present

## 2023-12-02 DIAGNOSIS — Z6832 Body mass index (BMI) 32.0-32.9, adult: Secondary | ICD-10-CM | POA: Diagnosis not present

## 2023-12-02 DIAGNOSIS — K760 Fatty (change of) liver, not elsewhere classified: Secondary | ICD-10-CM | POA: Diagnosis not present

## 2023-12-02 DIAGNOSIS — R7989 Other specified abnormal findings of blood chemistry: Secondary | ICD-10-CM | POA: Diagnosis not present

## 2024-01-11 DIAGNOSIS — D2372 Other benign neoplasm of skin of left lower limb, including hip: Secondary | ICD-10-CM | POA: Diagnosis not present

## 2024-01-18 DIAGNOSIS — K760 Fatty (change of) liver, not elsewhere classified: Secondary | ICD-10-CM | POA: Diagnosis not present
